# Patient Record
Sex: Male | Born: 1995 | Race: White | Hispanic: No | Marital: Single | Smoking: Never smoker
Health system: Southern US, Community
[De-identification: ages and names within clinical notes are randomized; demographics above are authoritative.]

## PROBLEM LIST (undated history)

## (undated) ENCOUNTER — Ambulatory Visit (HOSPITAL_COMMUNITY): Admission: EM | Payer: 59

## (undated) DIAGNOSIS — N200 Calculus of kidney: Secondary | ICD-10-CM

## (undated) HISTORY — DX: Calculus of kidney: N20.0

## (undated) HISTORY — PX: TYMPANOSTOMY TUBE PLACEMENT: SHX32

## (undated) HISTORY — PX: TONSILLECTOMY: SUR1361

---
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
## 2015-05-07 DIAGNOSIS — S61219D Laceration without foreign body of unspecified finger without damage to nail, subsequent encounter: Secondary | ICD-10-CM | POA: Insufficient documentation

## 2017-05-09 ENCOUNTER — Ambulatory Visit
Admission: EM | Admit: 2017-05-09 | Discharge: 2017-05-09 | Disposition: A | Discharge status: Home / Self Care | Payer: 59 | Attending: Family Medicine | Admitting: Family Medicine

## 2017-05-09 ENCOUNTER — Encounter: Payer: Self-pay | Admitting: Emergency Medicine

## 2017-05-09 ENCOUNTER — Other Ambulatory Visit: Payer: Self-pay

## 2017-05-09 DIAGNOSIS — R1011 Right upper quadrant pain: Secondary | ICD-10-CM

## 2017-05-09 LAB — COMPREHENSIVE METABOLIC PANEL
ALT: 14 U/L — ABNORMAL LOW (ref 17–63)
AST: 22 U/L (ref 15–41)
Albumin: 5 g/dL (ref 3.5–5.0)
Alkaline Phosphatase: 57 U/L (ref 38–126)
Anion gap: 7 (ref 5–15)
BUN: 16 mg/dL (ref 6–20)
CO2: 28 mmol/L (ref 22–32)
Calcium: 9.4 mg/dL (ref 8.9–10.3)
Chloride: 103 mmol/L (ref 101–111)
Creatinine, Ser: 1.27 mg/dL — ABNORMAL HIGH (ref 0.61–1.24)
GFR calc Af Amer: >60 mL/min (ref >60)
GFR calc non Af Amer: >60 mL/min (ref >60)
Glucose, Bld: 87 mg/dL (ref 65–99)
Potassium: 4.4 mmol/L (ref 3.5–5.1)
Sodium: 138 mmol/L (ref 135–145)
Total Bilirubin: 0.5 mg/dL (ref 0.3–1.2)
Total Protein: 7.9 g/dL (ref 6.5–8.1)

## 2017-05-09 LAB — CBC WITH DIFFERENTIAL/PLATELET
Basophils Absolute: 0 10*3/uL (ref 0–0.1)
Basophils Relative: 1 %
Eosinophils Absolute: 0.2 10*3/uL (ref 0–0.7)
Eosinophils Relative: 2 %
HCT: 46.6 % (ref 40.0–52.0)
Hemoglobin: 16.2 g/dL (ref 13.0–18.0)
Lymphocytes Relative: 37 %
Lymphs Abs: 2.8 10*3/uL (ref 1.0–3.6)
MCH: 30.5 pg (ref 26.0–34.0)
MCHC: 34.7 g/dL (ref 32.0–36.0)
MCV: 87.9 fL (ref 80.0–100.0)
Monocytes Absolute: 0.5 10*3/uL (ref 0.2–1.0)
Monocytes Relative: 7 %
Neutro Abs: 4.1 10*3/uL (ref 1.4–6.5)
Neutrophils Relative %: 53 %
Platelets: 271 10*3/uL (ref 150–440)
RBC: 5.31 MIL/uL (ref 4.40–5.90)
RDW: 12.7 % (ref 11.5–14.5)
WBC: 7.7 10*3/uL (ref 3.8–10.6)

## 2017-05-09 LAB — LIPASE, BLOOD: Lipase: 33 U/L (ref 11–51)

## 2017-05-09 MED ORDER — ONDANSETRON 8 MG PO TBDP: 8.0000 mg | ORAL_TABLET | Freq: Two times a day (BID) | ORAL | 0 refills | Status: DC

## 2017-05-09 MED ORDER — PANTOPRAZOLE SODIUM 20 MG PO TBEC: 20.0000 mg | DELAYED_RELEASE_TABLET | Freq: Every day | ORAL | 0 refills | Status: DC

## 2017-05-09 NOTE — ED Triage Notes (Signed)
Patient c/o abdominal pain off and on for years.  Patient states that his pain gets worse after he eats.

## 2017-05-09 NOTE — Progress Notes (Signed)
Gastroenterology Consultation  Referring Provider:     Hilario Quarry* Primary Care Physician:  [facility] Primary Gastroenterologist:  Dr. Servando Snare     Reason for Consultation:     Abdominal pain        HPI:   Aiden Helzer is a 22 y.o. y/o male referred for consultation & management of Abdominal pain by Dr. Terrence Dupont, [facility].  This patient comes today after reporting many years of abdominal pain with nausea.  The patient reports that the abdominal pain usually happens approximately 30 minutes after he eats.  The patient has never been seen in the past for this.  He had gone to urgent care yesterday for the same symptoms.  While there the patient was referred to see me and is now here today. The patient also states that he has been infected with to tick borne disease in the past.  When seen at the urgent care yesterday by the time he had seen a physician the urgent care doctor stated the patient was not having any abdominal pain or nausea. The patient was seen in urgent care in February for vomiting.  The patient states that his symptoms are much more prevalent in the summer and rarely happen any other time.  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  He also reports that he has had frequent bouts of nausea with dry heaving in the shower.  There was one instance that he states happened after eating a large [place] meal.  There is no report of any unexplained weight loss fevers chills black stools or bloody stools.  He also states that this is been going on for years.  No past medical history on file.  Past Surgical History:  Procedure Laterality Date  . TONSILLECTOMY    . TYMPANOSTOMY TUBE PLACEMENT      Prior to Admission medications   Medication Sig Start Date End Date Taking? Authorizing Provider  ondansetron (ZOFRAN ODT) 8 MG disintegrating tablet Take 1 tablet (8 mg total) by mouth 2 (two) times  daily. 05/09/17   Lutricia Feil, PA-C  pantoprazole (PROTONIX) 20 MG tablet Take 1 tablet (20 mg total) by mouth daily. 05/09/17   Lutricia Feil, PA-C    No family history on file.   Social History   Tobacco Use  . Smoking status: Never Smoker  . Smokeless tobacco: Current User    Types: Chew  Substance Use Topics  . Alcohol use: Yes  . Drug use: Never    Allergies as of 05/10/2017  . (No Known Allergies)    Review of Systems:    All systems reviewed and negative except where noted in HPI.   Physical Exam:  There were no vitals taken for this visit. No LMP for male patient. Psych:  Alert and cooperative. Normal mood and affect. General:   Alert,  Well-developed, well-nourished, pleasant and cooperative in NAD Head:  Normocephalic and atraumatic. Eyes:  Sclera clear, no icterus.   Conjunctiva pink. Ears:  Normal auditory acuity. Nose:  No deformity, discharge, or lesions. Mouth:  No deformity or lesions,oropharynx pink & moist. Neck:  Supple; no masses or thyromegaly. Lungs:  Respirations even and unlabored.  Clear throughout to auscultation.   No wheezes, crackles, or rhonchi. No acute distress. Heart:  Regular rate and rhythm; no murmurs, clicks, rubs, or gallops. Abdomen:  Normal bowel sounds.  No bruits.  Soft, non-tender and  non-distended without masses, hepatosplenomegaly or hernias noted.  No guarding or rebound tenderness.  Negative Carnett sign.   Rectal:  Deferred.  Msk:  Symmetrical without gross deformities.  Good, equal movement & strength bilaterally. Pulses:  Normal pulses noted. Extremities:  No clubbing or edema.  No cyanosis. Neurologic:  Alert and oriented x3;  grossly normal neurologically. Skin:  Intact without significant lesions or rashes.  No jaundice. Lymph Nodes:  No significant cervical adenopathy. Psych:  Alert and cooperative. Normal mood and affect.  Imaging Studies: No results found.  Assessment and Plan:   Andrews Tener is a 22  y.o. y/o male who comes in with a report of nausea and dry heaving mostly in the summer and usually happens when he is overheated from working or when he takes a shower in the morning.  It is not usually associated with food except for one instance when he ate a large amount of [place] food.  The patient has been told that this may be a vasovagal episode due to him being overheated and he has been told to decrease the temperature of his shower and to stay hydrated to see if his symptoms improve.  The patient has also been told that he should take the Protonix he was given just in case there is any acid causality for his nausea and vomiting.  I am suspect whether this is GI related since it is not associated with eating or drinking except for one episode but has been going on with him taking showers and being overheated at work for many years.  The patient has been explained the plan and agrees with it.  Midge Minium, MD. Clementeen Graham   Note: This dictation was prepared with Dragon dictation along with smaller phrase technology. Any transcriptional errors that result from this process are unintentional.

## 2017-05-09 NOTE — ED Provider Notes (Signed)
[facility]    CSN: [id number] Arrival date & time: 05/09/17  1441     History   Chief Complaint Chief Complaint  Patient presents with  . Abdominal Pain    HPI Todd Moon is a 22 y.o. male.   HPI  22 year old male presents with abdominal pain and nausea that he has had for years.  He states that the pain seems to be worse after he eats typically 30 minutes, last for an hour or 2 and then abates.  He will be nauseated but does not vomit.  He denies any diarrhea.  His pain apparently is mostly in the right upper quadrant.Does  Not take any medications.  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 He notices that the symptoms are always worse when it is hot outside or when the patient becomes overheated.  States that he has had this worked-up in the past but nobody has been able to tell him why he may have these symptoms.  Never seen a gastroenterologist. Present time today he denies any abdominal pain or nausea          History reviewed. No pertinent past medical history.  There are no active problems to display for this patient.   Past Surgical History:  Procedure Laterality Date  . TONSILLECTOMY    . TYMPANOSTOMY TUBE PLACEMENT         Home Medications    Prior to Admission medications   Medication Sig Start Date End Date Taking? Authorizing Provider  ondansetron (ZOFRAN ODT) 8 MG disintegrating tablet Take 1 tablet (8 mg total) by mouth 2 (two) times daily. 05/09/17   Lutricia Feil, PA-C  pantoprazole (PROTONIX) 20 MG tablet Take 1 tablet (20 mg total) by mouth daily. 05/09/17   Lutricia Feil, PA-C    Family History History reviewed. No pertinent family history.  Social History Social History   Tobacco Use  . Smoking status: Never Smoker  . Smokeless tobacco: Current User    Types: Chew  Substance Use Topics  . Alcohol use: Yes  . Drug use: Never     Allergies   Patient has  no known allergies.   Review of Systems Review of Systems  Constitutional: Positive for activity change and appetite change. Negative for chills, fatigue and fever.  Gastrointestinal: Positive for abdominal pain and nausea. Negative for blood in stool, constipation, diarrhea, rectal pain and vomiting.  All other systems reviewed and are negative.    Physical Exam Triage Vital Signs ED Triage Vitals  Enc Vitals Group     BP 05/09/17 1456 126/66     Pulse Rate 05/09/17 1456 82     Resp 05/09/17 1456 14     Temp 05/09/17 1456 98.6 F (37 C)     Temp Source 05/09/17 1456 Oral     SpO2 05/09/17 1456 98 %     Weight 05/09/17 1453 150 lb (68 kg)     Height 05/09/17 1453  (1.803 m)     Head Circumference --      Peak Flow --      Pain Score 05/09/17 1453 3     Pain Loc --      Pain Edu? --      Excl. in [place]? --    No data found.  Updated Vital Signs BP 126/66 (BP Location: Left Arm)   Pulse 82   Temp 98.6 F (  37 C) (Oral)   Resp 14   Ht  (1.803 m)   Wt 150 lb (68 kg)   SpO2 98%   BMI 20.92 kg/m   Visual Acuity Right Eye Distance:   Left Eye Distance:   Bilateral Distance:    Right Eye Near:   Left Eye Near:    Bilateral Near:     Physical Exam  Constitutional: He is oriented to person, place, and time. He appears well-developed and well-nourished.  Non-toxic appearance. He does not appear ill. No distress.  HENT:  Mouth/Throat: Oropharynx is clear and moist.  Eyes: Pupils are equal, round, and reactive to light.  Abdominal: Soft. Normal appearance and bowel sounds are normal. There is tenderness in the right upper quadrant. There is positive Murphy's sign.  Neurological: He is alert and oriented to person, place, and time.  Skin: Skin is warm and dry.  Psychiatric: He has a normal mood and affect. His behavior is normal.  Nursing note and vitals reviewed.    UC Treatments / Results  Labs (all labs ordered are listed, but only abnormal results  are displayed) Labs Reviewed  COMPREHENSIVE METABOLIC PANEL - Abnormal; Notable for the following components:      Result Value   Creatinine, Ser 1.27 (*)    ALT 14 (*)    All other components within normal limits  CBC WITH DIFFERENTIAL/PLATELET  LIPASE, BLOOD    EKG None  Radiology No results found.  Procedures Procedures (including critical care time)  Medications Ordered in UC Medications - No data to display  Initial Impression / Assessment and Plan / UC Course  I have reviewed the triage vital signs and the nursing notes.  Pertinent labs & imaging results that were available during my care of the patient were reviewed by me and considered in my medical decision making (see chart for details).     Plan: 1. Test/x-ray results and diagnosis reviewed with patient 2. rx as per orders; risks, benefits, potential side effects reviewed with patient 3. Recommend supportive treatment with Protonix and Zofran as necessary.  Commended that he follow-up with a gastroenterologist for further evaluation and studies as deemed necessary.  A name was provided to him.  His examination today was reassuring but I am concerned that this may represent a gallbladder issue. 4. F/u prn if symptoms worsen or don't improve  Final Clinical Impressions(s) / UC Diagnoses   Final diagnoses:  Right upper quadrant abdominal pain   Discharge Instructions   None    ED Prescriptions    Medication Sig Dispense Auth. Provider   ondansetron (ZOFRAN ODT) 8 MG disintegrating tablet Take 1 tablet (8 mg total) by mouth 2 (two) times daily. 6 tablet Ovid Curd P, PA-C   pantoprazole (PROTONIX) 20 MG tablet Take 1 tablet (20 mg total) by mouth daily. 30 tablet Lutricia Feil, PA-C     Controlled Substance Prescriptions [place] Controlled Substance Registry consulted? Not Applicable   Lutricia Feil, PA-C 05/09/17 8469

## 2017-05-10 ENCOUNTER — Encounter: Payer: Self-pay | Admitting: Gastroenterology

## 2017-05-10 ENCOUNTER — Encounter: Payer: Self-pay | Admitting: *Deleted

## 2017-05-10 ENCOUNTER — Ambulatory Visit: Payer: 59 | Admitting: Gastroenterology

## 2017-05-10 VITALS — BP 121/66 | HR 70 | Ht 71.0 in | Wt 144.0 lb

## 2017-05-10 DIAGNOSIS — R1115 Cyclical vomiting syndrome unrelated to migraine: Secondary | ICD-10-CM

## 2017-05-10 DIAGNOSIS — G43A1 Cyclical vomiting, intractable: Secondary | ICD-10-CM | POA: Diagnosis not present

## 2019-02-25 ENCOUNTER — Ambulatory Visit
Admission: EM | Admit: 2019-02-25 | Discharge: 2019-02-25 | Disposition: A | Discharge status: Home / Self Care | Payer: Self-pay | Attending: Family Medicine | Admitting: Family Medicine

## 2019-02-25 ENCOUNTER — Other Ambulatory Visit: Payer: Self-pay

## 2019-02-25 ENCOUNTER — Ambulatory Visit (INDEPENDENT_AMBULATORY_CARE_PROVIDER_SITE_OTHER): Payer: Self-pay

## 2019-02-25 DIAGNOSIS — N200 Calculus of kidney: Secondary | ICD-10-CM | POA: Insufficient documentation

## 2019-02-25 DIAGNOSIS — R1031 Right lower quadrant pain: Secondary | ICD-10-CM

## 2019-02-25 LAB — URINALYSIS, COMPLETE (UACMP) WITH MICROSCOPIC
Bacteria, UA: NONE SEEN
Glucose, UA: NEGATIVE mg/dL
Leukocytes,Ua: NEGATIVE
Nitrite: NEGATIVE
Protein, ur: 100 mg/dL — AB
RBC / HPF: >50 RBC/hpf (ref 0–5)
Specific Gravity, Urine: >1.03 — ABNORMAL HIGH (ref 1.005–1.030)
Squamous Epithelial / LPF: NONE SEEN (ref 0–5)
pH: 5.5 (ref 5.0–8.0)

## 2019-02-25 LAB — COMPREHENSIVE METABOLIC PANEL
ALT: 28 U/L (ref 0–44)
AST: 25 U/L (ref 15–41)
Albumin: 5.1 g/dL — ABNORMAL HIGH (ref 3.5–5.0)
Alkaline Phosphatase: 62 U/L (ref 38–126)
Anion gap: 9 (ref 5–15)
BUN: 17 mg/dL (ref 6–20)
CO2: 24 mmol/L (ref 22–32)
Calcium: 9.3 mg/dL (ref 8.9–10.3)
Chloride: 102 mmol/L (ref 98–111)
Creatinine, Ser: 1.21 mg/dL (ref 0.61–1.24)
GFR calc Af Amer: >60 mL/min (ref >60)
GFR calc non Af Amer: >60 mL/min (ref >60)
Glucose, Bld: 114 mg/dL — ABNORMAL HIGH (ref 70–99)
Potassium: 4.2 mmol/L (ref 3.5–5.1)
Sodium: 135 mmol/L (ref 135–145)
Total Bilirubin: 1.2 mg/dL (ref 0.3–1.2)
Total Protein: 8 g/dL (ref 6.5–8.1)

## 2019-02-25 LAB — CBC WITH DIFFERENTIAL/PLATELET
Abs Immature Granulocytes: 0.03 10*3/uL (ref 0.00–0.07)
Basophils Absolute: 0.1 10*3/uL (ref 0.0–0.1)
Basophils Relative: 1 %
Eosinophils Absolute: 0.1 10*3/uL (ref 0.0–0.5)
Eosinophils Relative: 1 %
HCT: 48.1 % (ref 39.0–52.0)
Hemoglobin: 16.8 g/dL (ref 13.0–17.0)
Immature Granulocytes: 0 %
Lymphocytes Relative: 31 %
Lymphs Abs: 2.3 10*3/uL (ref 0.7–4.0)
MCH: 30.1 pg (ref 26.0–34.0)
MCHC: 34.9 g/dL (ref 30.0–36.0)
MCV: 86.2 fL (ref 80.0–100.0)
Monocytes Absolute: 0.5 10*3/uL (ref 0.1–1.0)
Monocytes Relative: 7 %
Neutro Abs: 4.3 10*3/uL (ref 1.7–7.7)
Neutrophils Relative %: 60 %
Platelets: 306 10*3/uL (ref 150–400)
RBC: 5.58 MIL/uL (ref 4.22–5.81)
RDW: 11.6 % (ref 11.5–15.5)
WBC: 7.3 10*3/uL (ref 4.0–10.5)
nRBC: 0 % (ref 0.0–0.2)

## 2019-02-25 MED ORDER — ONDANSETRON 4 MG PO TBDP: 4.0000 mg | ORAL_TABLET | Freq: Three times a day (TID) | ORAL | 0 refills | Status: DC | PRN

## 2019-02-25 MED ORDER — ONDANSETRON HCL 4 MG/2ML IJ SOLN: 8.0000 mg | Freq: Once | INTRAMUSCULAR | Status: DC

## 2019-02-25 MED ORDER — IBUPROFEN 800 MG PO TABS: 800.0000 mg | ORAL_TABLET | Freq: Three times a day (TID) | ORAL | 0 refills | Status: DC

## 2019-02-25 MED ORDER — TRAMADOL HCL 50 MG PO TABS: 50.0000 mg | ORAL_TABLET | Freq: Three times a day (TID) | ORAL | 0 refills | Status: DC | PRN

## 2019-02-25 MED ORDER — ONDANSETRON 8 MG PO TBDP
8.0000 mg | ORAL_TABLET | Freq: Once | ORAL | Status: AC
  Administered 2019-02-25: 8 mg via ORAL

## 2019-02-25 MED ORDER — KETOROLAC TROMETHAMINE 60 MG/2ML IM SOLN
60.0000 mg | Freq: Once | INTRAMUSCULAR | Status: AC
  Administered 2019-02-25: 60 mg via INTRAMUSCULAR

## 2019-02-25 NOTE — ED Triage Notes (Signed)
Pt states sx started a month ago and was "peeing blood". Started again last night. 10/10 flank pain and difficulty urinating.

## 2019-02-25 NOTE — ED Provider Notes (Signed)
[facility]    CSN: [id number] Arrival date & time: 02/25/19  9147      History   Chief Complaint Chief Complaint  Patient presents with  . Flank Pain  . Nausea    HPI Todd Moon is a 24 y.o. male.   Todd Moon presents with complaints of right flank pain and urinary symptoms. States approximately 3-4 weeks ago he woke with right flank pain and had blood to his urine for at least a day or so. The flank pain then did go away but then he weeks of urgency to urinate and colicky discomfort, "swelling and numb" to right groin, ever since. This morning woke with 10/10 worse right flank pain as well as orange colored urine. Vomiting due to pain. Denies any previous similar. Denies concern for STD, denies penile discharge. No fevers. No personal history of kidney stones, family history, however.    ROS per HPI, negative if not otherwise mentioned.      History reviewed. No pertinent past medical history.  Patient Active Problem List   Diagnosis Date Noted  . Laceration of finger of left hand, subsequent encounter 05/07/2015  . Tremor 07/26/2012    Past Surgical History:  Procedure Laterality Date  . TONSILLECTOMY    . TYMPANOSTOMY TUBE PLACEMENT         Home Medications    Prior to Admission medications   Medication Sig Start Date End Date Taking? Authorizing Provider  ibuprofen (ADVIL) 800 MG tablet Take 1 tablet (800 mg total) by mouth 3 (three) times daily. 02/25/19   Zigmund Gottron, NP  ondansetron (ZOFRAN-ODT) 4 MG disintegrating tablet Take 1 tablet (4 mg total) by mouth every 8 (eight) hours as needed for nausea or vomiting. 02/25/19   Augusto Gamble B, NP  pantoprazole (PROTONIX) 20 MG tablet Take 1 tablet (20 mg total) by mouth daily. 05/09/17   Lorin Picket, PA-C  traMADol (ULTRAM) 50 MG tablet Take 1 tablet (50 mg total) by mouth every 8 (eight) hours as needed. 02/25/19   Zigmund Gottron, NP    Family History History reviewed. No  pertinent family history.  Social History Social History   Tobacco Use  . Smoking status: Never Smoker  . Smokeless tobacco: Current User    Types: Chew  Substance Use Topics  . Alcohol use: Yes    Comment: social  . Drug use: Never     Allergies   Patient has no known allergies.   Review of Systems Review of Systems   Physical Exam Triage Vital Signs ED Triage Vitals  Enc Vitals Group     BP 02/25/19 0931 139/88     Pulse Rate 02/25/19 0931 83     Resp 02/25/19 0931 18     Temp 02/25/19 0931 98.2 F (36.8 C)     Temp Source 02/25/19 0931 Oral     SpO2 02/25/19 0931 98 %     Weight 02/25/19 0928 160 lb (72.6 kg)     Height 02/25/19 0928 5\' 10"  (1.778 m)     Head Circumference --      Peak Flow --      Pain Score 02/25/19 0928 10     Pain Loc --      Pain Edu? --      Excl. in [place]? --    No data found.  Updated Vital Signs BP 139/88 (BP Location: Left Arm)   Pulse 83   Temp 98.2 F (36.8 C) (Oral)  Resp 18   Ht 5\' 10"  (1.778 m)   Wt 160 lb (72.6 kg)   SpO2 98%   BMI 22.96 kg/m    Physical Exam Constitutional:      Appearance: He is well-developed.  Cardiovascular:     Rate and Rhythm: Normal rate.  Pulmonary:     Effort: Pulmonary effort is normal.  Abdominal:     Palpations: Abdomen is soft.     Tenderness: There is right CVA tenderness.     Comments: Patient initially unable to even sit down due to pain, vomiting due to pain. Pain much improved and resting comfortably s/p zofran and toradol with mild right CVA tenderness and mild suprapubic pressure   Skin:    General: Skin is warm and dry.  Neurological:     Mental Status: He is alert and oriented to person, place, and time.      UC Treatments / Results  Labs (all labs ordered are listed, but only abnormal results are displayed) Labs Reviewed  URINALYSIS, COMPLETE (UACMP) WITH MICROSCOPIC - Abnormal; Notable for the following components:      Result Value   APPearance CLOUDY (*)      Specific Gravity, Urine >1.030 (*)    Hgb urine dipstick LARGE (*)    Bilirubin Urine SMALL (*)    Ketones, ur TRACE (*)    Protein, ur 100 (*)    All other components within normal limits  COMPREHENSIVE METABOLIC PANEL - Abnormal; Notable for the following components:   Glucose, Bld 114 (*)    Albumin 5.1 (*)    All other components within normal limits  CBC WITH DIFFERENTIAL/PLATELET    EKG   Radiology CT Renal Stone Study  Result Date: 02/25/2019 CLINICAL DATA:  Right flank pain intermittent dysuria and hematuria EXAM: CT ABDOMEN AND PELVIS WITHOUT CONTRAST TECHNIQUE: Multidetector CT imaging of the abdomen and pelvis was performed following the standard protocol without oral or IV contrast. COMPARISON:  None. FINDINGS: Lower chest: Lung bases are clear. Hepatobiliary: No focal liver lesions are appreciable on this noncontrast enhanced study. Gallbladder wall thickness is normal. There is no evident biliary duct dilatation. Pancreas: There is no pancreatic mass or inflammatory focus. Spleen: No splenic lesions are evident. Adrenals/Urinary Tract: Adrenals bilaterally appear normal. There is mild hydronephrosis on the right. No hydronephrosis evident on the left. No evident renal mass on either side. 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 There are scattered 1 mm calculi throughout each kidney. There is a 3 x 2 mm calculus in the distal right ureter near the ureterovesical junction. No other ureteral calculi are evident. Urinary bladder wall thickness is within normal limits for nearly empty state. Stomach/Bowel: There is no appreciable bowel wall or mesenteric thickening. Terminal ileum appears normal. No evident bowel obstruction. No free air or portal venous air. Vascular/Lymphatic: There is no abdominal aortic aneurysm. No vascular lesions are evident on this noncontrast enhanced study. There is no evident adenopathy in the abdomen or pelvis. Reproductive: Prostate and seminal  vesicles are normal in size and contour. No evident pelvic mass. Other: Appendix appears unremarkable. No evident abscess or ascites in the abdomen or pelvis. Musculoskeletal: No blastic or lytic bone lesions. No intramuscular or abdominal wall lesions are evident. IMPRESSION: 1. Mild hydronephrosis on the right with a 3 x 2 mm calculus in the distal right ureter near the ureterovesical junction. 2. 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 3. No bowel obstruction. No abscess in the abdomen or  pelvis. Appendix appears normal. Electronically Signed   By: Bretta Bang III M.D.   On: 02/25/2019 10:38    Procedures Procedures (including critical care time)  Medications Ordered in UC Medications  ketorolac (TORADOL) injection 60 mg (60 mg Intramuscular Given 02/25/19 0938)  ondansetron (ZOFRAN-ODT) disintegrating tablet 8 mg (8 mg Oral Given 02/25/19 7322)    Initial Impression / Assessment and Plan / UC Course  I have reviewed the triage vital signs and the nursing notes.  Pertinent labs & imaging results that were available during my care of the patient were reviewed by me and considered in my medical decision making (see chart for details).     10:25: Initial presentation and labs consistent with kidney stone, pain improved with toradol. Patient agreeable to CT as symptoms initially started approximately 3-4 weeks ago, has had colicky discomfort as well as urgency for weeks following initial onset of pain.    10:55: CT with mild hydronephrosis on the right with 3x2 mm stone at the ureterovesical junction. Small scattered 72mm calculi throughout both kidnies also noted by radiologist.   Patient without pain still s/p toradol. Stone is fairly small and has progressed, suspect that this may be a second stone rather than one which he has been symptomatic from for the past month. Pain management, fluids recommended.  Follow up with urology recommended if no  passage in the next few days. Return precautions provided. Patient verbalized understanding and agreeable to plan.  Ambulatory out of clinic without difficulty.    Final Clinical Impressions(s) / UC Diagnoses   Final diagnoses:  Kidney stone     Discharge Instructions     Your CT scan does demonstrate a kidney stone. It is not obviously obstructing, which is good. Appears likely that you could pass this. You do have other smaller stones visualized in both kidneys, also.  I question if you had another stone a few weeks ago, rather than this being the same one.  Ibuprofen three times a day, don't take for another 8 hours, tramadol for breakthrough pain. Zofran every 8 hours as needed for nausea or vomiting.   Drink water frequently and regularly to empty your bladder regularly.  I would recommend making a follow up appointment with urology for the end of the week so if haven't passed this or still uncomfortable you have follow up.  If significantly worsening or unable to urinate, fevers, or otherwise worsening please go to the ER.     ED Prescriptions    Medication Sig Dispense Auth. Provider   ibuprofen (ADVIL) 800 MG tablet Take 1 tablet (800 mg total) by mouth 3 (three) times daily. 30 tablet Makara Lanzo, Dorene Grebe B, NP   ondansetron (ZOFRAN-ODT) 4 MG disintegrating tablet Take 1 tablet (4 mg total) by mouth every 8 (eight) hours as needed for nausea or vomiting. 12 tablet Linus Mako B, NP   traMADol (ULTRAM) 50 MG tablet Take 1 tablet (50 mg total) by mouth every 8 (eight) hours as needed. 10 tablet Georgetta Haber, NP     I have reviewed the PDMP during this encounter.   Georgetta Haber, NP 02/25/19 1059

## 2019-02-25 NOTE — Discharge Instructions (Signed)
Your CT scan does demonstrate a kidney stone. It is not obviously obstructing, which is good. Appears likely that you could pass this. You do have other smaller stones visualized in both kidneys, also.  I question if you had another stone a few weeks ago, rather than this being the same one.  Ibuprofen three times a day, don't take for another 8 hours, tramadol for breakthrough pain. Zofran every 8 hours as needed for nausea or vomiting.   Drink water frequently and regularly to empty your bladder regularly.  I would recommend making a follow up appointment with urology for the end of the week so if haven't passed this or still uncomfortable you have follow up.  If significantly worsening or unable to urinate, fevers, or otherwise worsening please go to the ER.

## 2019-09-01 ENCOUNTER — Ambulatory Visit
Admission: EM | Admit: 2019-09-01 | Discharge: 2019-09-01 | Disposition: A | Discharge status: Home / Self Care | Payer: Self-pay | Attending: Family Medicine | Admitting: Family Medicine

## 2019-09-01 ENCOUNTER — Encounter: Payer: Self-pay | Admitting: Emergency Medicine

## 2019-09-01 ENCOUNTER — Other Ambulatory Visit: Payer: Self-pay

## 2019-09-01 DIAGNOSIS — Z20822 Contact with and (suspected) exposure to covid-19: Secondary | ICD-10-CM | POA: Insufficient documentation

## 2019-09-01 NOTE — Discharge Instructions (Signed)
Results available tomorrow.  Take care  Dr. Adriana Simas

## 2019-09-01 NOTE — ED Provider Notes (Signed)
[facility]    CSN: [id number] Arrival date & time: 09/01/19  1035      History   Chief Complaint Chief Complaint  Patient presents with   Covid Exposure   raspy voice   dry throat   HPI   24 year old male presents with the above complaints.  Patient's mother has just tested positive for COVID-19.  He reports a raspy voice and a dry throat in the past 2 to 3 days.  No other symptoms.  No fever.  He desires Covid testing today.  No other reported symptoms.  No other complaints.  Past Medical History:  Diagnosis Date   Kidney stones     Patient Active Problem List   Diagnosis Date Noted   Laceration of finger of left hand, subsequent encounter 05/07/2015   Tremor 07/26/2012    Past Surgical History:  Procedure Laterality Date   TONSILLECTOMY     TYMPANOSTOMY TUBE PLACEMENT         Home Medications    Prior to Admission medications   Medication Sig Start Date End Date Taking? Authorizing Provider  pantoprazole (PROTONIX) 20 MG tablet Take 1 tablet (20 mg total) by mouth daily. 05/09/17 09/01/19  Lutricia Feil, PA-C    Family History Family History  Problem Relation Age of Onset   Healthy Mother    Healthy Father     Social History Social History   Tobacco Use   Smoking status: Never Smoker   Smokeless tobacco: Former Neurosurgeon    Types: Associate Professor Use: Never used  Substance Use Topics   Alcohol use: Yes    Comment: social   Drug use: Never     Allergies   Patient has no known allergies.   Review of Systems Review of Systems  Constitutional: Negative.   HENT: Positive for sore throat and voice change.    Physical Exam Triage Vital Signs ED Triage Vitals  Enc Vitals Group     BP 09/01/19 1120 119/89     Pulse Rate 09/01/19 1120 90     Resp 09/01/19 1120 18     Temp 09/01/19 1120 98.1 F (36.7 C)     Temp Source 09/01/19 1120 Oral     SpO2 09/01/19 1120 99 %     Weight 09/01/19 1121 160 lb  (72.6 kg)     Height 09/01/19 1121 5\' 10"  (1.778 m)     Head Circumference --      Peak Flow --      Pain Score 09/01/19 1120 0     Pain Loc --      Pain Edu? --      Excl. in GC? --    Updated Vital Signs BP 119/89 (BP Location: Left Arm)    Pulse 90    Temp 98.1 F (36.7 C) (Oral)    Resp 18    Ht 5\' 10"  (1.778 m)    Wt 72.6 kg    SpO2 99%    BMI 22.96 kg/m   Visual Acuity Right Eye Distance:   Left Eye Distance:   Bilateral Distance:    Right Eye Near:   Left Eye Near:    Bilateral Near:     Physical Exam Constitutional:      General: He is not in acute distress.    Appearance: Normal appearance. He is not ill-appearing.  HENT:     Head: Normocephalic and atraumatic.     Mouth/Throat:  Pharynx: Oropharynx is clear. No oropharyngeal exudate or posterior oropharyngeal erythema.  Eyes:     General:        Right eye: No discharge.        Left eye: No discharge.     Conjunctiva/sclera: Conjunctivae normal.  Cardiovascular:     Rate and Rhythm: Normal rate and regular rhythm.  Pulmonary:     Effort: Pulmonary effort is normal.     Breath sounds: Normal breath sounds. No wheezing, rhonchi or rales.  Neurological:     Mental Status: He is alert.  Psychiatric:        Mood and Affect: Mood normal.        Behavior: Behavior normal.    UC Treatments / Results  Labs (all labs ordered are listed, but only abnormal results are displayed) Labs Reviewed  SARS CORONAVIRUS 2 (TAT 6-24 HRS)    EKG   Radiology No results found.  Procedures Procedures (including critical care time)  Medications Ordered in UC Medications - No data to display  Initial Impression / Assessment and Plan / UC Course  I have reviewed the triage vital signs and the nursing notes.  Pertinent labs & imaging results that were available during my care of the patient were reviewed by me and considered in my medical decision making (see chart for details).    24 year old male presents for  Covid testing.  Patient states that he has had a raspy voice and a dry throat for the past few days.  He has had a recent Covid exposure.  Desires testing today.  He is feeling well.  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  Awaiting test results.  Final Clinical Impressions(s) / UC Diagnoses   Final diagnoses:  Exposure to COVID-19 virus     Discharge Instructions     Results available tomorrow.  Take care  Dr. Adriana Simas    ED Prescriptions    None     PDMP not reviewed this encounter.   Tommie Sams, [place] 09/01/19 1254

## 2019-09-01 NOTE — ED Triage Notes (Signed)
Patient in today c/o +covid exposure, raspy voice and dry throat x 2-3 days. Patient's mother tested positive for covid yesterday. Patient has taken OTC Mucinex for his symptoms.

## 2019-09-02 LAB — SARS CORONAVIRUS 2 (TAT 6-24 HRS): SARS Coronavirus 2: POSITIVE — AB

## 2020-08-08 ENCOUNTER — Ambulatory Visit (HOSPITAL_COMMUNITY)
Admission: EM | Admit: 2020-08-08 | Discharge: 2020-08-08 | Disposition: A | Discharge status: Home / Self Care | Payer: 59 | Attending: Family | Admitting: Family

## 2020-08-08 ENCOUNTER — Other Ambulatory Visit: Payer: Self-pay

## 2020-08-08 DIAGNOSIS — F411 Generalized anxiety disorder: Secondary | ICD-10-CM | POA: Diagnosis not present

## 2020-08-08 MED ORDER — HYDROXYZINE HCL 25 MG PO TABS: 25.0000 mg | ORAL_TABLET | Freq: Three times a day (TID) | ORAL | 0 refills | Status: DC | PRN

## 2020-08-08 MED ORDER — HYDROXYZINE HCL 25 MG PO TABS: 25.0000 mg | ORAL_TABLET | Freq: Three times a day (TID) | ORAL | 0 refills | Status: AC | PRN

## 2020-08-08 NOTE — ED Provider Notes (Addendum)
Behavioral Health Urgent Care Medical Screening Exam  Patient Name: Todd Moon MRN: [id number] Date of Evaluation: 08/08/20 Chief Complaint:   Diagnosis:  Final diagnoses:  GAD (generalized anxiety disorder)    History of Present illness: Todd Moon is a 25 y.o. male.  Presents as a walk-in to [facility] urgent care accompanied by his wife.  He is reporting reoccurring thoughts of impending doom.  Reports his symptoms started a few days ago. "  It scared me."  He denies suicidal or homicidal ideations.  Denies auditory or visual hallucinations.  Did not previous inpatient admissions.  Denied history with mental health illness.  Does report using marijuana.  Reports paranoid ideations mainly after smoking marijuana.  NP spoke to wife and patient regarding any safety concerns. Does report having hunting guns in the home however states that weapons are secured.  And has no thoughts to harm himself.  Patient to initiate hydroxyzine for anxiety attacks.  Patient to consider starting antidepressants when he come's to partial hospitalization programming.  Patient was receptive to plan. Support, encouragement and reassurances was provided.   Psychiatric Specialty Exam  Presentation  General Appearance:Appropriate for Environment  Eye Contact:Fair  Speech:Clear and Coherent  Speech Volume:Normal  Handedness:Right   Mood and Affect  Mood:Anxious; Depressed  Affect:Congruent   Thought Process  Thought Processes:Coherent  Descriptions of Associations:Intact  Orientation:Full (Time, Place and Person)  Thought Content:Logical    Hallucinations:None  Ideas of Reference:None  Suicidal Thoughts:No  Homicidal Thoughts:No   Sensorium  Memory:Immediate Good; Recent Good; Remote Good  Judgment:Fair  Insight:Fair   Executive Functions  Concentration:Fair  Attention Span:Fair  Recall:Good  Fund of Knowledge:Good  Language:Good   Psychomotor Activity   Psychomotor Activity: No data recorded  Assets  Assets:Desire for Improvement; Intimacy; Social Support   Sleep  Sleep:Fair  Number of hours:  No data recorded  Nutritional Assessment (For OBS and FBC admissions only) Has the patient had a weight loss or gain of 10 pounds or more in the last 3 months?: No Has the patient had a decrease in food intake/or appetite?: No Does the patient have dental problems?: No Does the patient have eating habits or behaviors that may be indicators of an eating disorder including binging or inducing vomiting?: No Has the patient recently lost weight without trying?: No Has the patient been eating poorly because of a decreased appetite?: No Malnutrition Screening Tool Score: 0   Physical Exam: Physical Exam HENT:     Nose: Nose normal.  Eyes:     Pupils: Pupils are equal, round, and reactive to light.  Cardiovascular:     Rate and Rhythm: Normal rate and regular rhythm.     Pulses: Normal pulses.  Pulmonary:     Effort: Pulmonary effort is normal.  Neurological:     Mental Status: He is alert and oriented to person, place, and time.  Psychiatric:        Attention and Perception: Attention and perception normal.        Mood and Affect: Mood and affect normal.        Speech: Speech normal.        Behavior: Behavior normal.        Thought Content: Thought content is paranoid. Thought content is not delusional. Thought content does not include suicidal ideation.        Cognition and Memory: Cognition and memory normal.        Judgment: Judgment normal.   Review of Systems  Eyes: Negative.  Respiratory: Negative.    Cardiovascular: Negative.   Genitourinary: Negative.   Skin: Negative.   Neurological: Negative.   Endo/Heme/Allergies: Negative.   Psychiatric/Behavioral:  Positive for depression. Negative for hallucinations and suicidal ideas. The patient is nervous/anxious.   All other systems reviewed and are negative. Blood pressure  120/70, pulse 80, temperature 98.3 F (36.8 C), temperature source Oral, resp. rate 16, SpO2 100 %. There is no height or weight on file to calculate BMI.  Musculoskeletal: Strength & Muscle Tone: within normal limits Gait & Station: normal Patient leans: N/A   [facility] MSE Discharge Disposition for Follow up and Recommendations: Based on my evaluation the patient does not appear to have an emergency medical condition and can be discharged with resources and follow up care in outpatient services for Medication Management, Partial Hospitalization Program, Individual Therapy, and Group Therapy   Oneta Rack, NP 08/08/2020, 10:20 AM

## 2020-08-08 NOTE — Progress Notes (Signed)
Patient presented to the [facility] with his wife.  Patient states that a thought came in his head yesterday about dying while he was keeping his son and it scared him so much he had to take his son to stay with a family member. He states that he is not suicidal and states that he has no plan to kill himself and states that he has never made any attempts to harm himself.  Patient states that he does not feel like he is depressed, but states that he feels like he may have anxiety and obsessive thoughts.  He states that the thoughts of dying yesterday and this morning made him throw up and he states that he woke up this morning throwing up.  Patient states that he has no stressors that should be making him feel this way, he states that his job and his marriage are good and he states that they just moved into a new house.  Patient denies HI/Psychosis, but admits to daily use of marijuana,  Patient states that his sleep and appetite have been good.  There is a gun in the house and it was recommended that the wife remove the gun as a precautionary measure.  Patient does not currently feel like he would hurt himself or anyone else.  Patient currently has no mental health provider and is not on any medications.

## 2020-08-08 NOTE — ED Notes (Signed)
Pt discharged with resources and recommendations for follow up care provided. Safety maintained.

## 2020-08-08 NOTE — Discharge Instructions (Addendum)
Take all medications as prescribed. Keep all follow-up appointments as scheduled.  Do not consume alcohol or use illegal drugs while on prescription medications. Report any adverse effects from your medications to your primary care provider promptly.  In the event of recurrent symptoms or worsening symptoms, call 911, a crisis hotline, or go to the nearest emergency department for evaluation.   

## 2020-08-10 ENCOUNTER — Telehealth (HOSPITAL_COMMUNITY): Payer: Self-pay | Admitting: Psychiatry

## 2020-08-10 NOTE — Telephone Encounter (Signed)
D:  Hillery Jacks, NP referred pt to MH-IOP.  A:  Placed call to orient pt and provide him with a start date.  Pt stopped cm before she could orient.  According to pt, he has an appt with [facility] on 08-12-20.  Encouraged pt to call case manager back if she changes his mind about MH-IOP.  Inform Tanika.  R:  Pt receptive.

## 2020-08-12 ENCOUNTER — Ambulatory Visit (HOSPITAL_COMMUNITY)
Admission: EM | Admit: 2020-08-12 | Discharge: 2020-08-13 | Disposition: A | Discharge status: Other Acute Inpatient Hospital | Payer: 59

## 2020-08-12 ENCOUNTER — Other Ambulatory Visit: Payer: Self-pay

## 2020-08-12 DIAGNOSIS — F410 Panic disorder [episodic paroxysmal anxiety] without agoraphobia: Secondary | ICD-10-CM

## 2020-08-12 DIAGNOSIS — F411 Generalized anxiety disorder: Secondary | ICD-10-CM

## 2020-08-13 ENCOUNTER — Emergency Department (EMERGENCY_DEPARTMENT_HOSPITAL)
Admission: EM | Admit: 2020-08-13 | Discharge: 2020-08-13 | Disposition: A | Discharge status: Other Acute Inpatient Hospital | Payer: 59 | Source: Home / Self Care | Attending: Emergency Medicine | Admitting: Emergency Medicine

## 2020-08-13 ENCOUNTER — Encounter (HOSPITAL_COMMUNITY): Payer: Self-pay

## 2020-08-13 ENCOUNTER — Other Ambulatory Visit: Payer: Self-pay

## 2020-08-13 ENCOUNTER — Inpatient Hospital Stay (HOSPITAL_COMMUNITY)
Admission: AD | Admit: 2020-08-13 | Discharge: 2020-08-18 | DRG: 880 | Disposition: A | Discharge status: Home / Self Care | Payer: 59 | Source: Intra-hospital | Attending: Psychiatry | Admitting: Psychiatry

## 2020-08-13 ENCOUNTER — Encounter (HOSPITAL_COMMUNITY): Payer: Self-pay | Admitting: Emergency Medicine

## 2020-08-13 DIAGNOSIS — F411 Generalized anxiety disorder: Principal | ICD-10-CM | POA: Diagnosis present

## 2020-08-13 DIAGNOSIS — F419 Anxiety disorder, unspecified: Secondary | ICD-10-CM | POA: Diagnosis not present

## 2020-08-13 DIAGNOSIS — F32A Depression, unspecified: Secondary | ICD-10-CM | POA: Diagnosis present

## 2020-08-13 DIAGNOSIS — R45851 Suicidal ideations: Secondary | ICD-10-CM | POA: Insufficient documentation

## 2020-08-13 DIAGNOSIS — R197 Diarrhea, unspecified: Secondary | ICD-10-CM | POA: Diagnosis not present

## 2020-08-13 DIAGNOSIS — F410 Panic disorder [episodic paroxysmal anxiety] without agoraphobia: Secondary | ICD-10-CM

## 2020-08-13 DIAGNOSIS — Z87891 Personal history of nicotine dependence: Secondary | ICD-10-CM | POA: Insufficient documentation

## 2020-08-13 DIAGNOSIS — R45850 Homicidal ideations: Secondary | ICD-10-CM | POA: Diagnosis not present

## 2020-08-13 DIAGNOSIS — F1994 Other psychoactive substance use, unspecified with psychoactive substance-induced mood disorder: Secondary | ICD-10-CM | POA: Diagnosis present

## 2020-08-13 DIAGNOSIS — F22 Delusional disorders: Secondary | ICD-10-CM | POA: Insufficient documentation

## 2020-08-13 DIAGNOSIS — Z20822 Contact with and (suspected) exposure to covid-19: Secondary | ICD-10-CM | POA: Insufficient documentation

## 2020-08-13 DIAGNOSIS — F1220 Cannabis dependence, uncomplicated: Secondary | ICD-10-CM | POA: Diagnosis present

## 2020-08-13 DIAGNOSIS — Z87442 Personal history of urinary calculi: Secondary | ICD-10-CM | POA: Diagnosis not present

## 2020-08-13 DIAGNOSIS — F1924 Other psychoactive substance dependence with psychoactive substance-induced mood disorder: Secondary | ICD-10-CM | POA: Diagnosis present

## 2020-08-13 LAB — COMPREHENSIVE METABOLIC PANEL
ALT: 17 U/L (ref 0–44)
AST: 24 U/L (ref 15–41)
Albumin: 4.7 g/dL (ref 3.5–5.0)
Alkaline Phosphatase: 52 U/L (ref 38–126)
Anion gap: 10 (ref 5–15)
BUN: 15 mg/dL (ref 6–20)
CO2: 24 mmol/L (ref 22–32)
Calcium: 9.4 mg/dL (ref 8.9–10.3)
Chloride: 104 mmol/L (ref 98–111)
Creatinine, Ser: 1.09 mg/dL (ref 0.61–1.24)
GFR, Estimated: >60 mL/min (ref >60)
Glucose, Bld: 82 mg/dL (ref 70–99)
Potassium: 4.3 mmol/L (ref 3.5–5.1)
Sodium: 138 mmol/L (ref 135–145)
Total Bilirubin: 1.7 mg/dL — ABNORMAL HIGH (ref 0.3–1.2)
Total Protein: 7.1 g/dL (ref 6.5–8.1)

## 2020-08-13 LAB — LIPID PANEL
Cholesterol: 168 mg/dL (ref 0–200)
HDL: 40 mg/dL — ABNORMAL LOW (ref >40)
LDL Cholesterol: 121 mg/dL — ABNORMAL HIGH (ref 0–99)
Total CHOL/HDL Ratio: 4.2 RATIO
Triglycerides: 35 mg/dL (ref <150)
VLDL: 7 mg/dL (ref 0–40)

## 2020-08-13 LAB — HEMOGLOBIN A1C
Hgb A1c MFr Bld: 5.3 % (ref 4.8–5.6)
Mean Plasma Glucose: 105.41 mg/dL

## 2020-08-13 LAB — RAPID URINE DRUG SCREEN, HOSP PERFORMED
Amphetamines: NOT DETECTED
Barbiturates: NOT DETECTED
Benzodiazepines: NOT DETECTED
Cocaine: NOT DETECTED
Opiates: NOT DETECTED
Tetrahydrocannabinol: POSITIVE — AB

## 2020-08-13 LAB — RESP PANEL BY RT-PCR (FLU A&B, COVID) ARPGX2
Influenza A by PCR: NEGATIVE
Influenza B by PCR: NEGATIVE
SARS Coronavirus 2 by RT PCR: NEGATIVE

## 2020-08-13 LAB — SALICYLATE LEVEL: Salicylate Lvl: <7 mg/dL — ABNORMAL LOW (ref 7.0–30.0)

## 2020-08-13 LAB — TSH: TSH: 0.96 u[IU]/mL (ref 0.350–4.500)

## 2020-08-13 LAB — ETHANOL: Alcohol, Ethyl (B): <10 mg/dL (ref <10)

## 2020-08-13 LAB — ACETAMINOPHEN LEVEL: Acetaminophen (Tylenol), Serum: <10 ug/mL — ABNORMAL LOW (ref 10–30)

## 2020-08-13 MED ORDER — TRAZODONE HCL 50 MG PO TABS
50.0000 mg | ORAL_TABLET | Freq: Every evening | ORAL | Status: DC | PRN
  Filled 2020-08-13: qty 1

## 2020-08-13 MED ORDER — ALUM & MAG HYDROXIDE-SIMETH 200-200-20 MG/5ML PO SUSP: 30.0000 mL | Freq: Four times a day (QID) | ORAL | Status: DC | PRN

## 2020-08-13 MED ORDER — QUETIAPINE FUMARATE 25 MG PO TABS
25.0000 mg | ORAL_TABLET | Freq: Every day | ORAL | Status: DC
  Administered 2020-08-13 – 2020-08-14 (×2): 25 mg via ORAL
  Filled 2020-08-13 (×4): qty 1

## 2020-08-13 MED ORDER — LORAZEPAM 1 MG PO TABS
1.0000 mg | ORAL_TABLET | Freq: Once | ORAL | Status: AC
  Administered 2020-08-13: 1 mg via ORAL
  Filled 2020-08-13: qty 1

## 2020-08-13 MED ORDER — ACETAMINOPHEN 325 MG PO TABS
650.0000 mg | ORAL_TABLET | Freq: Four times a day (QID) | ORAL | Status: DC | PRN
Start: 2020-08-13 — End: 2020-08-13

## 2020-08-13 MED ORDER — ALUM & MAG HYDROXIDE-SIMETH 200-200-20 MG/5ML PO SUSP: 30.0000 mL | ORAL | Status: DC | PRN

## 2020-08-13 MED ORDER — MAGNESIUM HYDROXIDE 400 MG/5ML PO SUSP: 30.0000 mL | Freq: Every day | ORAL | Status: DC | PRN

## 2020-08-13 MED ORDER — ONDANSETRON HCL 4 MG PO TABS: 4.0000 mg | ORAL_TABLET | Freq: Three times a day (TID) | ORAL | Status: DC | PRN

## 2020-08-13 MED ORDER — TRAZODONE HCL 50 MG PO TABS: 50.0000 mg | ORAL_TABLET | Freq: Every evening | ORAL | Status: DC | PRN

## 2020-08-13 MED ORDER — ACETAMINOPHEN 325 MG PO TABS: 650.0000 mg | ORAL_TABLET | Freq: Four times a day (QID) | ORAL | Status: DC | PRN

## 2020-08-13 MED ORDER — ONDANSETRON 4 MG PO TBDP: 4.0000 mg | ORAL_TABLET | Freq: Three times a day (TID) | ORAL | Status: DC | PRN

## 2020-08-13 MED ORDER — ESCITALOPRAM OXALATE 10 MG PO TABS
10.0000 mg | ORAL_TABLET | Freq: Every day | ORAL | Status: DC
  Administered 2020-08-13: 10 mg via ORAL
  Filled 2020-08-13: qty 1

## 2020-08-13 MED ORDER — IBUPROFEN 200 MG PO TABS: 600.0000 mg | ORAL_TABLET | Freq: Three times a day (TID) | ORAL | Status: DC | PRN

## 2020-08-13 MED ORDER — QUETIAPINE FUMARATE 25 MG PO TABS
25.0000 mg | ORAL_TABLET | Freq: Every day | ORAL | Status: DC
  Filled 2020-08-13: qty 1

## 2020-08-13 MED ORDER — LORAZEPAM 2 MG/ML IJ SOLN
1.0000 mg | Freq: Once | INTRAMUSCULAR | Status: AC
  Administered 2020-08-13: 1 mg via INTRAMUSCULAR
  Filled 2020-08-13: qty 1

## 2020-08-13 NOTE — Plan of Care (Signed)
Newly admitted. Cooperative but continues to endorse passive SI with remorse. Contracts for safety. Reports that he has not been taking medications. Willing to participate in treatment and wanting to get better. Reports that he has support, people to live for. Has no additional concerns.

## 2020-08-13 NOTE — ED Triage Notes (Signed)
Pt has been having SI/HI and AVH, went to [facility] and told to come here for overnight obs due to no beds.

## 2020-08-13 NOTE — Progress Notes (Signed)
Patient ID: Todd Moon, male   DOB: 06-05-1995, 25 y.o.   MRN: [id number] Patient presents involuntarily secondary to increased anxiety, suicidal and homicidal thoughts. This started with increased homicidal thoughts toward his 105 year-old baby, then towards other different people. Patient Was unable to fight the thoughts, then became suicidal as well. He reports that this is the first time he experiences this. He reports that this may be related to use of Marijuana. He denies use of any other drugs but admits that he has used cocaine and mushrooms in the past. He reports history of mental illnesses in the family "my sister and my mother take Prozac". He denies hx of abuse. He denies financial difficulties. Patient lives with his fiancee and their 84 year-old baby. He is currently employed fulltime. His fiancee is employed as well.  Patient reports that "I want the thoughts to go away, I don't want to kill my baby, I love my family".  Patient was cooperative with assessment. Skin assessment revealed no issues. Patient was admitted and oriented to the unit. Safety precautions initiated.

## 2020-08-13 NOTE — BH Assessment (Addendum)
Comprehensive Clinical Assessment (CCA) Note  08/13/2020 Baron Parmelee [id number] Disposition: Pt was seen at [facility] by this clinician and Melbourne Abts, PA.  He performed the MSE.  He recommends inpatient psychiatric care.  There are no adult beds at [facility] tonight.  Patient is not eligible for [facility] [place].  Patient will be going to [facility] until placement is found.  Pt looks tired and discheveled.  Pt is not responding to internal stimuli.  He does not evidence any delusional thought processes.  Patient can express himself clearly and concisely when redirected.  Pt is finding the panic attacks and intrusive thoughts to be debilitating.  It is affecting his quality of life.  Patient does not trust himself.    Patient has no current outpatient provider.  He has never had any inpatient care history.     Chief Complaint:  Chief Complaint  Patient presents with   Suicidal   Visit Diagnosis: Generalized Anxiety D/O    CCA Screening, Triage and Referral (STR)  Patient Reported Information How did you hear about [place]? Self (Pt is accompanied by fiance Rolanda Jay)  What Is the Reason for Your Visit/Call Today? Pt was brought to [facility] by girlfriend.  He is still having anxiety over toughts of killing other people.  He has no intention of actually killing anyone and has no plans to do so.  He will having racing thoughts of harming others and this causes him to throw up.  His symptoms include the raciing thoughts, nausea, vomiting, biting fingernails, lack of appetite, restless, talking escessively, blurry vision.  Patient says he has had some degree of anxiety younger age.  Pt says he has had these prolonged panic attacks since this past Friday (07/29).  Pt says that he "has a bad temper but not physically violent."  Pt says that he has had some SI but no plan or intention.  Patient never has had any previous attempts.  Pt has had some thoughts of "easily hurting someone" but I have  no "intention or plan and never will."  Pt was seen here on 07/30.  He did go to [facility] last night (08.02) and was seen via telehealth by two psychiatrists.  The psychiatrist arranged for him to go to [facility].  He was there at [facility] today around before 17:30 and waited for about 3 hours and still did not get seen.  Pt and fiance left [facility] and they set them up with a telehealth appointment for Friday (08/05) at 19:30/  Patient and fiance decided to come back to [facility].  Pt says he used to use marijuana daily and woul dsmoke multiple bowls per day.  he has not had anymore since Friday, July 29.  Pt says tha the was given hydroxyzine when he came to [facility] on 07/30.  He says he used it on Saturday and he says he took one on Sunday after work.  He says it did not help much  How Long Has This Been Causing You Problems? <Week  What Do You Feel Would Help You the Most Today? Treatment for Depression or other mood problem   Have You Recently Had Any Thoughts About Hurting Yourself? Yes  Are You Planning to Commit Suicide/Harm Yourself At This time? No   Have you Recently Had Thoughts About Hurting Someone Karolee Ohs? Yes  Are You Planning to Harm Someone at This Time? No  Explanation: No data recorded  Have You Used  Any Alcohol or Drugs in the Past 24 Hours? No  How Long Ago Did You Use Drugs or Alcohol? No data recorded What Did You Use and How Much? Last use was yesterday   Do You Currently Have a Therapist/Psychiatrist? No  Name of Therapist/Psychiatrist: No data recorded  Have You Been Recently Discharged From Any Office Practice or Programs? No  Explanation of Discharge From Practice/Program: No data recorded    CCA Screening Triage Referral Assessment Type of Contact: Face-to-Face  Telemedicine Service Delivery:   Is this Initial or Reassessment? No data recorded Date Telepsych consult ordered in [facility]:  No data recorded Time Telepsych consult ordered in  [facility]:  No data recorded Location of Assessment: [facility] Assessment Services  Provider Location: [facility] Assessment Services   Collateral Involvement: Interior and spatial designer (fiance)   Does Patient Have a Automotive engineer Guardian? No data recorded Name and Contact of Legal Guardian: No data recorded If Minor and Not Living with Parent(s), Who has Custody? No data recorded Is CPS involved or ever been involved? Never  Is APS involved or ever been involved? Never   Patient Determined To Be At Risk for Harm To Self or Others Based on Review of Patient Reported Information or Presenting Complaint? Yes, for Self-Harm  Method: No data recorded Availability of Means: No data recorded Intent: No data recorded Notification Required: No data recorded Additional Information for Danger to Others Potential: No data recorded Additional Comments for Danger to Others Potential: No data recorded Are There Guns or Other Weapons in Your Home? No data recorded Types of Guns/Weapons: No data recorded Are These Weapons Safely Secured?                            No data recorded Who Could Verify You Are Able To Have These Secured: No data recorded Do You Have any Outstanding Charges, Pending Court Dates, Parole/Probation? No data recorded Contacted To Inform of Risk of Harm To Self or Others: No data recorded   Does Patient Present under Involuntary Commitment? No  IVC Papers Initial File Date: No data recorded  [place] of Residence: Other (Comment) ([place])   Patient Currently Receiving the Following Services: Not Receiving Services   Determination of Need: Urgent (48 hours)   Options For Referral: Inpatient Hospitalization     CCA Biopsychosocial Patient Reported Schizophrenia/Schizoaffective Diagnosis in Past: No   Strengths: Hunting, runnign heavy equipment   Mental Health Symptoms Depression:   Difficulty Concentrating; Hopelessness   Duration of Depressive symptoms:   Duration of Depressive Symptoms: Less than two weeks   Mania:   Racing thoughts; Change in energy/activity   Anxiety:    Difficulty concentrating; Restlessness; Tension; Worrying   Psychosis:   None   Duration of Psychotic symptoms:    Trauma:   Avoids reminders of event; Guilt/shame   Obsessions:   Cause anxiety; Intrusive/time consuming   Compulsions:   None   Inattention:   Disorganized   Hyperactivity/Impulsivity:   Fidgets with hands/feet   Oppositional/Defiant Behaviors:   None   Emotional Irregularity:   Potentially harmful impulsivity   Other Mood/Personality Symptoms:  No data recorded   Mental Status Exam Appearance and self-care  Stature:   Average   Weight:   Average weight   Clothing:   Disheveled; Casual   Grooming:   Neglected (Has not showered since 08/02.)   Cosmetic use:   None   Posture/gait:   Normal  Motor activity:   Repetitive; Restless   Sensorium  Attention:   Distractible   Concentration:   Anxiety interferes   Orientation:   X5   Recall/memory:   Defective in Remote   Affect and Mood  Affect:   Congruent; Anxious   Mood:   Depressed; Anxious   Relating  Eye contact:   Normal   Facial expression:   Anxious   Attitude toward examiner:   Cooperative   Thought and Language  Speech flow:  Clear and Coherent   Thought content:   Appropriate to Mood and Circumstances   Preoccupation:   Suicide; Homicidal   Hallucinations:   None   Organization:  No data recorded  [employer] of Knowledge:   Average   Intelligence:   Average   Abstraction:   Normal   Judgement:   Impaired   Reality Testing:   Adequate   Insight:   Fair   Decision Making:   Impulsive   Social Functioning  Social Maturity:   Impulsive   Social Judgement:   Normal   Stress  Stressors:   Other (Comment) (Pt cannot identify any stressors.)   Coping Ability:   Overwhelmed   Skill  Deficits:   Decision making   Supports:   Family; Friends/Service system     Religion:    Leisure/Recreation:    Exercise/Diet:     CCA Employment/Education Employment/Work Situation:    Education:     CCA Family/Childhood History Family and Relationship History:    Childhood History:     Child/Adolescent Assessment:     CCA Substance Use Alcohol/Drug Use:                           ASAM's:  Six Dimensions of Multidimensional Assessment  Dimension 1:  Acute Intoxication and/or Withdrawal Potential:      Dimension 2:  Biomedical Conditions and Complications:      Dimension 3:  Emotional, Behavioral, or Cognitive Conditions and Complications:     Dimension 4:  Readiness to Change:     Dimension 5:  Relapse, Continued use, or Continued Problem Potential:     Dimension 6:  Recovery/Living Environment:     ASAM Severity Score:    ASAM Recommended Level of Treatment:     Substance use Disorder (SUD)    Recommendations for Services/Supports/Treatments:    Discharge Disposition:    DSM5 Diagnoses: Patient Active Problem List   Diagnosis Date Noted   Laceration of finger of left hand, subsequent encounter 05/07/2015   Tremor 07/26/2012     Referrals to Alternative Service(s): Referred to Alternative Service(s):   Place:   Date:   Time:    Referred to Alternative Service(s):   Place:   Date:   Time:    Referred to Alternative Service(s):   Place:   Date:   Time:    Referred to Alternative Service(s):   Place:   Date:   Time:     Wandra Mannan

## 2020-08-13 NOTE — Progress Notes (Signed)
[facility] Group Notes:  (Nursing/MHT/Case Management/Adjunct)  Date:  08/13/2020  Time:  2015  Type of Therapy:   wrap up group  Participation Level:  Active  Participation Quality:  Appropriate, Attentive, Sharing, and Supportive  Affect:  Anxious and Depressed  Cognitive:  Alert  Insight:  Improving  Engagement in Group:  Engaged  Modes of Intervention:  Clarification, Education, and Support  Summary of Progress/Problems: Positive thinking and self-care were discussed.   Johann Capers S 08/13/2020, 9:09 PM

## 2020-08-13 NOTE — Discharge Instructions (Signed)
Patient to be transferred to [facility] ED for further observation/safety monitoring.

## 2020-08-13 NOTE — ED Notes (Signed)
Report called to [facility] RN. [facility] arrives. Pt steady gait to exit with NT. Belongings and packet given to [employer]. Pt VSS and no distress on dc.

## 2020-08-13 NOTE — ED Provider Notes (Signed)
[facility] EMERGENCY DEPARTMENT Provider Note   CSN: [id number] Arrival date & time: 08/13/20  0148     History Chief Complaint  Patient presents with   Suicidal    Ival Pacer is a 25 y.o. male with a h/o ADHD who presents to the emergency department from [facility] for continued observation. Per conversation with Melbourne Abts, PA-C, and chart review, patient's was initially recommended for overnight observation, but is unable to remain overnight at [facility] as patient does not meet their observation criteria since he is from out of [place].  There were no beds available at [facility] so he was transferred to [facility], ED for further observation and safety monitoring.  The patient states that he presented to [facility] for homicidal ideation.  He reports that he has been having increasingly worrisome homicidal thoughts toward various individuals over the last few days.  He states that his symptoms began a few days ago after he saw a knife laying in the kitchen.  He began to have thoughts of using the knife to stab his young son.  He states that he became so concerned that he might harm his son that he had his significant other have their son stay at another family member's home for the last few days.  He reports that he has had difficulty performing his tasks at work over the last few days due to worsening intrusive thoughts of harming others.  He reports that he has been seeing visions of guns in his head.  Due to worsening thoughts of harming others, the patient states that he now is having thoughts of wanting to harm or kill himself to avoid hurting anyone else.  He denies having a specific plan to harm himself.  He denies auditory or visual hallucinations.  Since onset of his symptoms, he went to [facility] ED where he was kept overnight and evaluated by psychiatry.  He then followed up and had another outpatient psychiatry appointment earlier today.  He was started on Vistaril,  which did not help with his symptoms. Due to continue, worsening concern, his significant other drove him from [facility], where they reside, to [facility] after researching the facility online to see if he can get help.  He reports that there are multiple guns in the home, but they have been locked up due to his worsening symptoms.  No history of previous psychiatric hospitalizations.  He denies fever, chills, chest pain, shortness of breath, nausea, vomiting, diarrhea, headache, visual changes, or other complaints.   He endorses marijuana use.  He denies tobacco use.  No other illicit or recreational substance use.  He reports social alcohol use.  No alcohol use over the last few days.   The history is provided by the patient and medical records. No language interpreter was used.      Past Medical History:  Diagnosis Date   Kidney stones     Patient Active Problem List   Diagnosis Date Noted   Laceration of finger of left hand, subsequent encounter 05/07/2015   Tremor 07/26/2012    Past Surgical History:  Procedure Laterality Date   TONSILLECTOMY     TYMPANOSTOMY TUBE PLACEMENT         Family History  Problem Relation Age of Onset   Healthy Mother    Healthy Father     Social History   Tobacco Use   Smoking status: Never   Smokeless tobacco: Former    Types: Sports administrator  Quit date: 09/01/2018  Vaping Use   Vaping Use: Never used  Substance Use Topics   Alcohol use: Yes    Comment: social   Drug use: Never    Home Medications Prior to Admission medications   Medication Sig Start Date End Date Taking? Authorizing Provider  hydrOXYzine (ATARAX/VISTARIL) 25 MG tablet Take 1 tablet (25 mg total) by mouth 3 (three) times daily as needed. Patient taking differently: Take 25 mg by mouth 3 (three) times daily as needed for anxiety. 08/08/20  Yes Oneta Rack, NP  pantoprazole (PROTONIX) 20 MG tablet Take 1 tablet (20 mg total) by mouth daily. 05/09/17 09/01/19  Lutricia Feil, PA-C    Allergies    Patient has no known allergies.  Review of Systems   Review of Systems  Constitutional:  Negative for appetite change and fever.  HENT:  Negative for congestion and sore throat.   Respiratory:  Negative for cough, shortness of breath and wheezing.   Cardiovascular:  Negative for chest pain and palpitations.  Gastrointestinal:  Negative for abdominal pain, diarrhea, nausea and vomiting.  Genitourinary:  Negative for dysuria.  Musculoskeletal:  Negative for back pain.  Skin:  Negative for rash.  Allergic/Immunologic: Negative for immunocompromised state.  Neurological:  Negative for seizures, syncope, weakness, numbness and headaches.  Psychiatric/Behavioral:  Positive for behavioral problems, sleep disturbance and suicidal ideas. Negative for confusion, hallucinations and self-injury. The patient is not nervous/anxious and is not hyperactive.        HI   Physical Exam Updated Vital Signs BP (!) 138/93   Pulse 94   Temp 98.3 F (36.8 C) (Oral)   Resp 20   SpO2 100%   Physical Exam Vitals and nursing note reviewed.  Constitutional:      General: He is not in acute distress.    Appearance: He is well-developed. He is not ill-appearing or toxic-appearing.  HENT:     Head: Normocephalic.  Eyes:     Conjunctiva/sclera: Conjunctivae normal.  Cardiovascular:     Rate and Rhythm: Normal rate and regular rhythm.     Heart sounds: No murmur heard. Pulmonary:     Effort: Pulmonary effort is normal. No respiratory distress.     Breath sounds: No stridor. No wheezing, rhonchi or rales.  Chest:     Chest wall: No tenderness.  Abdominal:     General: There is no distension.     Palpations: Abdomen is soft. There is no mass.     Tenderness: There is no abdominal tenderness. There is no right CVA tenderness, left CVA tenderness, guarding or rebound.     Hernia: No hernia is present.  Musculoskeletal:     Cervical back: Neck supple.  Skin:    General: Skin  is warm and dry.  Neurological:     Mental Status: He is alert.  Psychiatric:        Mood and Affect: Mood is anxious. Affect is tearful.        Speech: Speech normal.        Behavior: Behavior is withdrawn. Behavior is cooperative.        Thought Content: Thought content is paranoid. Thought content includes homicidal and suicidal ideation.     Comments: Tearful in triage when discussing his symptoms.  He is very fearful of harming someone.  Anxious appearing.    ED Results / Procedures / Treatments   Labs (all labs ordered are listed, but only abnormal results are displayed) Labs Reviewed  RESP PANEL BY RT-PCR (FLU A&B, COVID) [id number]    EKG None  Radiology No results found.  Procedures Procedures   Medications Ordered in ED Medications  ibuprofen (ADVIL) tablet 600 mg (has no administration in time range)  ondansetron (ZOFRAN) tablet 4 mg (has no administration in time range)  alum & mag hydroxide-simeth (MAALOX/MYLANTA) 200-200-20 MG/5ML suspension 30 mL (has no administration in time range)  LORazepam (ATIVAN) injection 1 mg (1 mg Intramuscular Given 08/13/20 0418)    ED Course  I have reviewed the triage vital signs and the nursing notes.  Pertinent labs & imaging results that were available during my care of the patient were reviewed by me and considered in my medical decision making (see chart for details).    MDM Rules/Calculators/A&P                           This is a 25 year old male with a history of ADHD and no history of previous inpatient behavioral hospitalizations who presents to the emergency department from [facility] for continued stabilization.  The patient has infrequent ED use, but over the last few days has been seen at [facility] and at another ED. he also adds that he followed up with an outpatient psychiatrist earlier in the day between these visits due to concern for his symptoms.   Vital signs are stable.  The patient had labs drawn at [facility] ED on  8/2, which I have reviewed.  UA positive for THC.  No other acute findings.  COVID-19 test was obtained today in the ED and is negative.  I reviewed the patient's note from [facility].  Initial plan was for overnight stabilization and reevaluation by psychiatry in the a.m.  After the evaluating the patient in triage, I contacted Melbourne Abts, PA-C with psychiatry to express concern about this patient's disposition as I believe that he would strongly benefit from inpatient treatment.  This is a patient who, as previously mentioned, does not frequent the ED and has no patient of inpatient behavioral health admissions.  He has become increasingly concerned about his symptoms to the point where he had his young son taken out of the home due to concern for harming his life.  The patient has tried multiple avenues to seek help for his symptoms unsuccessfully.  He was discharged with Vistaril, which he has taken as prescribed, with no improvement.  I strongly feel that the next step is for inpatient behavioral health admission for stabilization.  I did have a long discussion with the patient at bedside about what his hopes were for his ED visit today.  He expressed that he would like to start medication to help manage his symptoms, but I discussed that most of the medications that he would be started on would take time before they would start working and the medications may need to be adjusted.  After discussing inpatient treatment, patient was agreeable.  At this time, behavioral health has changed his disposition to inpatient.  He remains voluntary at this time and is in agreement with the plan.  However, if he became uncooperative or attempted to leave, I think he would benefit from IVC.  Pt medically cleared at this time. Psych hold orders and home med orders placed. TTS consult pending; please see psych team notes for further documentation of care. Pt stable at time of med clearance.     Final Clinical Impression(s)  / ED Diagnoses Final diagnoses:  [facility] (  [facility]  Homicidal ideations    Rx / DC Orders ED Discharge Orders     None        Barkley BoardsMcDonald, Rateel Beldin A, PA-C 08/13/20 0445    Glynn Octaveancour, Stephen, MD 08/13/20 [phone]

## 2020-08-13 NOTE — ED Notes (Signed)
Pt clothes placed in locker #3, Pt cell phone , wallet and charger placed in envelope and given to security for safe.
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
## 2020-08-13 NOTE — Progress Notes (Signed)
   08/13/20 2148  Psych Admission Type (Psych Patients Only)  Admission Status Voluntary  Psychosocial Assessment  Patient Complaints Anxiety;Sadness;Depression  Eye Contact Fair  Facial Expression Sullen;Sad;Worried  Affect Appropriate to circumstance;Sad;Sullen  Speech Logical/coherent  Interaction Forwards little;Submissive  Motor Activity Other (Comment) (WDL)  Appearance/Hygiene Unremarkable  Behavior Characteristics Cooperative;Appropriate to situation  Mood Depressed;Anxious;Sullen;Sad  Thought Process  Coherency WDL  Content Blaming self  Delusions None reported or observed  Perception WDL  Hallucination None reported or observed  Judgment Poor  Confusion None  Danger to Self  Current suicidal ideation? Passive  Self-Injurious Behavior No self-injurious ideation or behavior indicators observed or expressed   Agreement Not to Harm Self Yes  Description of Agreement Verbal contract  Danger to Others  Danger to Others None reported or observed

## 2020-08-13 NOTE — Progress Notes (Signed)
Patient information has been sent to [facility] via secure chat to review for potential admission. Patient meets inpatient criteria per Melbourne Abts, PA-C.   Situation ongoing, CSW will continue to monitor progress.    Signed:  Damita Dunnings, MSW, LCSW-A  08/13/2020 8:35 AM

## 2020-08-13 NOTE — Consult Note (Signed)
[facility] Face-to[facility]Face [facility] Consult   Reason for Consult: For placement to inpatient psychiatry Hospital Referring Physician:  [place] Abtsody Taylor PA Patient Identification: Todd LovelyDustin Moon MRN:  [id number] Principal Diagnosis: GAD (generalized anxiety disorder) Diagnosis:  Principal Problem:   GAD (generalized anxiety disorder)   Total Time spent with patient: 20 minutes  Subjective:  Pt is seen and examined today. Pt states his mood is tired and anxious. Pt rates his mood at 5/10 (10 is the best mood). Pt states he hasn't slept for 2-3 days but slept little bit after taking medications last night. Pt states his appetite is poor. Pt states he is still very anxious and worried about all these intrusive thoughts.  Currently, he endorses intrusive thoughts of hurting himself and others.  He denies any plan or intent. He is not able to contract for safety states he is a good boy and he knows that he will not do that but he is really anxious and worried about these thoughts. Currently, he denies visual and auditory hallucination.  He reports nausea today. Patient safety states he had dizziness, nausea and vomiting yesterday but it is getting better.  Pt denies any medication side effects and has been tolerating it well.  He states he used hydroxyzine in the past which has made him drowsy.  He states he does not want to start that again. HPI:  Todd Moon is a 25 y.o. male patient admitted with history of generalized anxiety disorder who [facility] for "bad thoughts".  Patient reports having intrusive negative thoughts of hurting himself and others since he used marijuana on 08/07/2020.  Patient [facility] ED [facility] [place]  Patient is seen and examined again this morning.  Patient continues to meet criteria for inpatient hospitalization.  CSW to help with inpatient psychiatry hospital admission.  Past Psychiatric  History: GAD, See H&P  Risk to Self: Yes  (Patient denies SI currently on exam, but endorses having consistent thoughts of killing himself over the past week.) Risk to Others:  Yes(Patient denies HI currently on exam, but endorses having consistent thoughts of killing/harming others over the past week.) Prior Inpatient Therapy:  No Prior Outpatient Therapy:  No  Past Medical History:  Past Medical History:  Diagnosis Date   Kidney stones     Past Surgical History:  Procedure Laterality Date   TONSILLECTOMY     TYMPANOSTOMY TUBE PLACEMENT     Family History:  Family History  Problem Relation Age of Onset   Healthy Mother    Healthy Father    Family Psychiatric  History: see H&P Social History:  Social History   Substance and Sexual Activity  Alcohol Use Yes   Comment: social     Social History   Substance and Sexual Activity  Drug Use Never    Social History   Socioeconomic History   Marital status: Single    Spouse name: Not on file   Number of children: Not on file   Years of education: Not on file   Highest education level: Not on file  Occupational History   Not on file  Tobacco Use   Smoking status: Never   Smokeless tobacco: Former    Types: Chew    Quit date: 09/01/2018  Vaping Use   Vaping Use: Never used  Substance and Sexual Activity   Alcohol use: Yes    Comment: social   Drug  use: Never   Sexual activity: Not on file  Other Topics Concern   Not on file  Social History Narrative   Not on file   Social Determinants of Health   Financial Resource Strain: Not on file  Food Insecurity: Not on file  Transportation Needs: Not on file  Physical Activity: Not on file  Stress: Not on file  Social Connections: Not on file   Additional Social History:    Allergies:  No Known Allergies  Labs:  Results for orders placed or performed during the hospital encounter of 08/13/20 (from the past 48 hour(s))  Resp Panel by RT-PCR (Flu A&B, Covid)  Nasopharyngeal Swab     Status: None   Collection Time: 08/13/20  2:32 AM   Specimen: Nasopharyngeal Swab; Nasopharyngeal(NP) swabs in vial transport medium  Result Value Ref Range   SARS Coronavirus 2 by RT PCR NEGATIVE NEGATIVE    Comment: (NOTE) SARS-CoV-2 target nucleic acids are NOT DETECTED.  The SARS-CoV-2 RNA is generally detectable in upper respiratory specimens during the acute phase of infection. The lowest concentration of SARS-CoV-2 viral copies this assay can detect is 138 copies/mL. A negative result does not preclude SARS-Cov-2 infection and should not be used as the sole basis for treatment or other patient management decisions. A negative result may occur with  improper specimen collection/handling, submission of specimen other than nasopharyngeal swab, presence of viral mutation(s) within the areas targeted by this assay, and inadequate number of viral copies(<138 copies/mL). A negative result must be combined with clinical observations, patient history, and epidemiological information. The expected result is Negative.  Fact Sheet for Patients:  BloggerCourse.com  Fact Sheet for Healthcare Providers:  SeriousBroker.it  This test is no t yet approved or cleared by the [place] FDA and  has been authorized for detection and/or diagnosis of SARS-CoV-2 by FDA under an Emergency Use Authorization (EUA). This EUA will remain  in effect (meaning this test can be used) for the duration of the COVID-19 declaration under Section 564(b)(1) of the Act, 21 U.S.C.section 360bbb-3(b)(1), unless the authorization is terminated  or revoked sooner.       Influenza A by PCR NEGATIVE NEGATIVE   Influenza B by PCR NEGATIVE NEGATIVE    Comment: (NOTE) The Xpert Xpress SARS-CoV-2/FLU/RSV plus assay is intended as an aid in the diagnosis of influenza from Nasopharyngeal swab specimens and should not be used as a sole basis for  treatment. Nasal washings and aspirates are unacceptable for Xpert Xpress SARS-CoV-2/FLU/RSV testing.  Fact Sheet for Patients: BloggerCourse.com  Fact Sheet for Healthcare Providers: SeriousBroker.it  This test is not yet approved or cleared by the [place] FDA and has been authorized for detection and/or diagnosis of SARS-CoV-2 by FDA under an Emergency Use Authorization (EUA). This EUA will remain in effect (meaning this test can be used) for the duration of the COVID-19 declaration under Section 564(b)(1) of the Act, 21 U.S.C. section 360bbb-3(b)(1), unless the authorization is terminated or revoked.  Performed at [facility], [address] [place], [place] [address]     Current Facility-Administered Medications  Medication Dose Route Frequency Provider Last Rate Last Admin   alum & mag hydroxide-simeth (MAALOX/MYLANTA) 200-200-20 MG/5ML suspension 30 mL  30 mL Oral Q6H PRN McDonald, Mia A, PA-C       ibuprofen (ADVIL) tablet 600 mg  600 mg Oral Q8H PRN McDonald, Mia A, PA-C       ondansetron (ZOFRAN) tablet 4 mg  4 mg Oral  Q8H PRN McDonald, Mia A, PA-C       QUEtiapine (SEROQUEL) tablet 25 mg  25 mg Oral QHS Karsten Ro, MD       traZODone (DESYREL) tablet 50 mg  50 mg Oral QHS PRN Jaclyn Shaggy, PA-C       Current Outpatient Medications  Medication Sig Dispense Refill   hydrOXYzine (ATARAX/VISTARIL) 25 MG tablet Take 1 tablet (25 mg total) by mouth 3 (three) times daily as needed. (Patient taking differently: Take 25 mg by mouth 3 (three) times daily as needed for anxiety.) 30 tablet 0    Musculoskeletal: Strength & Muscle Tone: within normal limits Gait & Station: normal Patient leans: N/A            Psychiatric Specialty Exam:  Presentation  General Appearance: Casual  Eye Contact:Good  Speech:Normal Rate  Speech Volume:Normal  Handedness:Right   Mood and Affect  Mood:Anxious;  Depressed  Affect:Congruent   Thought Process  Thought Processes:Coherent; Goal Directed; Linear  Descriptions of Associations:Intact  Orientation:Full (Time, Place and Person)  Thought Content:Rumination  History of Schizophrenia/Schizoaffective disorder:No  Duration of Psychotic Symptoms:N/A  Hallucinations:Hallucinations: None  Ideas of Reference:None  Suicidal Thoughts:Suicidal Thoughts: Yes, Passive (Ruminations and intrusive thoughts,) SI Passive Intent and/or Plan: Without Intent; Without Plan  Homicidal Thoughts:Homicidal Thoughts: Yes, Passive (Ruminations and intrusive thoughts) HI Passive Intent and/or Plan: Without Intent; Without Plan   Sensorium  Memory:Immediate Good; Recent Good; Remote Good  Judgment:Fair  Insight:Fair   Executive Functions  Concentration:Fair  Attention Span:Fair  Recall:Fair  Fund of Knowledge:Fair  Language:Fair   Psychomotor Activity  Psychomotor Activity:Psychomotor Activity: Increased; Restlessness   Assets  Assets:Communication Skills; Desire for Improvement; Financial Resources/Insurance; Housing; Leisure Time; Physical Health; Resilience; Social Support; Transportation; Vocational/Educational   Sleep  Sleep:Sleep: Fair Number of Hours of Sleep: 5   Physical Exam: Physical Exam Vitals and nursing note reviewed.  Constitutional:      General: He is in acute distress.     Appearance: Normal appearance. He is not ill-appearing, toxic-appearing or diaphoretic.  Pulmonary:     Effort: Pulmonary effort is normal.  Neurological:     General: No focal deficit present.     Mental Status: He is alert and oriented to person, place, and time.   Review of Systems  Constitutional:  Negative for chills and fever.  HENT:  Negative for hearing loss.   Eyes:  Negative for blurred vision.  Respiratory:  Negative for shortness of breath.   Cardiovascular:  Negative for chest pain.  Gastrointestinal:  Positive for  nausea.  Neurological:  Negative for dizziness and headaches.  Psychiatric/Behavioral:  Positive for depression, substance abuse and suicidal ideas. Negative for hallucinations. The patient is nervous/anxious.        Passive suicidal and homicidal thoughts-intrusive thoughts without any plan or intent  Blood pressure 127/77, pulse 80, temperature 98.5 F (36.9 C), temperature source Oral, resp. rate 14, SpO2 98 %. There is no height or weight on file to calculate BMI.  Treatment Plan Summary: Patient meets criteria for inpatient hospitalization.  Patient has passive suicidal and homicidal intrusive thoughts. Plan   -Stop Lexapro -Start Seroquel 25 mg nightly. -Continue trazodone 50 mg as needed nightly for insomnia. -Continue Zofran 4 mg every 8 hours as needed for nausea and vomiting. -Patient continues to meet criteria for inpatient psychiatry hospitalization.  -CSW to help with placement. -Labs and tests ordered- Cbc, CMP, TSH, U tox, ethanol level, acetaminophen levels, salicylate level, HbA1c, lipid panel and EKG  Disposition: Recommend psychiatric Inpatient admission when medically cleared. Patient continues to meet criteria for inpatient psychiatry hospitalization.   Karsten Ro, MD 08/13/2020 11:50 AM

## 2020-08-13 NOTE — ED Notes (Signed)
Pt belongings to [facility] to go to [facility]

## 2020-08-13 NOTE — H&P (Addendum)
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 Reference:None  Suicidal Thoughts: No  Homicidal Thoughts:Patient denies HI or any homicidal intent or specific homicidal plan, but does continue to endorse fleeting generalized thoughts of harming/killing others Sensorium  Memory:Immediate Good; Recent Good; Remote Good  Judgment:Fair  Insight:Fair   Executive Functions  Concentration:Fair  Attention Span:Fair  Recall:Good  Fund of Knowledge:Good  Language:Fair   Psychomotor Activity  Psychomotor Activity:Psychomotor Activity: Increased; Restlessness   Assets  Assets:Communication Skills; Desire for Improvement; Financial Resources/Insurance; Housing;  Leisure Time; Physical Health; Resilience; Social Support; Transportation; Vocational/Educational   Sleep  Sleep:Sleep: Fair Number of Hours of Sleep: 5    Physical Exam: Physical Exam Vitals reviewed.  Constitutional:      General: He is not in acute distress.    Appearance: He is not ill-appearing, toxic-appearing or diaphoretic.  HENT:     Head: Normocephalic and atraumatic.     Right Ear: External ear normal.     Left Ear: External ear normal.     Nose: Nose normal.  Eyes:     General:        Right eye: No discharge.        Left eye: No discharge.     Conjunctiva/sclera: Conjunctivae normal.  Cardiovascular:     Rate and Rhythm: Tachycardia present.  Pulmonary:     Effort: Pulmonary effort is normal. No respiratory distress.  Musculoskeletal:        General: Normal range of motion.     Cervical back: Normal range of motion.  Neurological:     General: No focal deficit present.     Mental Status: He is alert and oriented to person, place, and time.     Comments: No tremor noted.   Psychiatric:        Attention and Perception: He does not perceive auditory or visual hallucinations.        Mood and Affect: Mood is anxious and depressed.        Speech: Speech is rapid and pressured.        Behavior: Behavior is hyperactive. Behavior is not agitated,  slowed, aggressive, withdrawn or combative. Behavior is cooperative.        Thought Content: Thought content is not paranoid or delusional. Thought content does not include suicidal ideation.     Comments: Affect mood congruent and tearful. Patient denies HI or any homicidal intent or specific homicidal plan, but does continue to endorse fleeting generalized thoughts of harming/killing others.  Patient states that earlier today he became fixated on the thought that he had 08/07/2020 mentioned above regarding harming his son which has continued to cause him distress.    Review of Systems  Constitutional:  Positive for  malaise/fatigue and weight loss. Negative for chills, diaphoresis and fever.  HENT:  Negative for congestion.   Respiratory:  Negative for cough and shortness of breath.   Cardiovascular:  Positive for palpitations. Negative for chest pain.       Positive for chest tightness.   Gastrointestinal:  Positive for nausea and vomiting. Negative for abdominal pain, constipation and diarrhea.  Musculoskeletal:  Negative for joint pain and myalgias.  Neurological:  Positive for dizziness. Negative for seizures and headaches.  Psychiatric/Behavioral:  Positive for depression, substance abuse and suicidal ideas. Negative for hallucinations and memory loss. The patient is nervous/anxious. The patient does not have insomnia.   All other systems reviewed and are negative. Patient states that he has been having frequent panic attacks associated with nausea, vomiting, chest tightness, lightheadedness, dizziness, shortness of breath, and palpitations.  Patient endorses nausea currently but denies any of these additional symptoms mentioned above at this time on exam.  Vitals: Blood pressure (!) 119/98, pulse (!) 114, temperature 98.3 F (36.8 C), temperature source Oral, resp. rate 20, height  (1.778 m), weight 68 kg, SpO2 97 %. Body mass index is 21.52 kg/m.  Treatment Plan Summary: Admit patient to the [facility] behavioral [facility] for further crisis stabilization and treatment. Daily contact with patient to assess and evaluate symptoms and progress in treatment and Medication management and therapy   Current medications ordered:  -Tylenol 650 mg PO Q6H PRN for mild pain -Will continue to not have Vistaril ordered at this time due to patient stating he does not want to take Vistaril due to the medication not helping with his anxiety and making him feel drowsy and itchy (see HPI for details).   -Maalox/Mylanta 30 mL p.o. every 4 hours as needed for indigestion  -Milk of Magnesia 30 mL p.o. daily as  needed for mild constipation  -Zofran ODT 4 mg p.o. every 8 hours as needed for nausea/vomiting  -Seroquel 25 mg p.o. daily at bedtime for GAD/depression/sleep/mood stability  -Trazodone 50 mg p.o. at bedtime as needed for sleep  Patient educated on side effect profiles of Seroquel and trazodone, as well as necessary monitoring parameters for Seroquel/antipsychotic medications including EKG, TSH, lipid panel, and hemoglobin A1c.  Patient verbalizes understanding of this education.  Patient reports that his sister and mother currently take Prozac and that they have responded well to the Prozac.  Patient states that he is not sure what his mother and sister took Prozac for.  Patient is inquiring about if it would be a good idea to start him on Prozac due to patient's mothers and sisters reported success with Prozac.  Explained to the patient that I was hesitant to start Prozac right away with the Seroquel at this time before patient has a trial of Seroquel due to potential side effect of Prozac increasing SI and recommended to start with initiating the Seroquel 25  mg p.o. daily at bedtime first without the Prozac and for the patient to continue this discussion about potentially starting Prozac during patient's hospitalization with the patient treatment team. Patient agreed with this plan. Did explain to the patient that Prozac could potentially be initiated during this hospitalization if necessary based on patient's response to current medication trials and patient verbalized understanding and agreement of this.   Observation Level/Precautions:  15 minute checks  Laboratory:   Labs/Tests Reviewed: -PCR Flu A & B, COVID: Negative -CMP: Total bilirubin slightly elevated at 1.7 mg/dL.  CMP otherwise unremarkable -Hemoglobin A1c: Within normal limits -Lipid panel: HDL slightly reduced at 40 mg/dL: Essentially normal, LDL cholesterol slightly elevated at 121 mg/dL.  Lipid panel otherwise unremarkable -TSH:  Within normal limits -Acetaminophen level: Within normal limits -Ethanol level: Within normal limits -Salicylate level: Within normal limits -UDS: Positive for THC -CBC with differential: Added onto patient's previous blood draw: Results pending -08/13/20 EKG: Normal sinus rhythm with sinus arrhythmia, no signs of MI/ischemia, no acute/concerning findings, ventricular rate 74 bpm, PR interval 142 ms, QRS 96 ms, QT/QTC 348/386 ms.  QT/QTC appropriate to initiate antipsychotic medications.  Psychotherapy: Group Therapy  Medications:  See treatment plan summary above/MAR  Consultations:  TBD (none at this time)  Discharge Concerns: Patient's ability to contract for safety, SI, HI, intrusive/ruminating thoughts about suicide and homicide/harming himself/harming others, patient being discharged with psychotropic medication regimen at helps improve patient's symptoms, patient's access to firearms and knives, patient having these intrusive thoughts about harming others with a child at home  Estimated LOS: 3-5 days (patient's estimated length of stay subjective change depending on patient's progress in treatment during his inpatient stay)  Other:     Physician Treatment Plan for Primary Diagnosis: GAD (generalized anxiety disorder) Long Term Goal(s): Improvement in symptoms so as ready for discharge  Short Term Goals: Ability to identify changes in lifestyle to reduce recurrence of condition will improve, Ability to verbalize feelings will improve, Ability to disclose and discuss suicidal ideas, Ability to demonstrate self-control will improve, Ability to identify and develop effective coping behaviors will improve, Compliance with prescribed medications will improve, and Ability to identify triggers associated with substance abuse/mental health issues will improve  Physician Treatment Plan for Secondary Diagnosis: Principal Problem:   GAD (generalized anxiety disorder) Active Problems:   Substance  induced mood disorder (HCC)   Panic attacks   Cannabis use disorder, severe, dependence (HCC)  Long Term Goal(s): Improvement in symptoms so as ready for discharge  Short Term Goals: Ability to identify changes in lifestyle to reduce recurrence of condition will improve, Ability to verbalize feelings will improve, Ability to disclose and discuss suicidal ideas, Ability to demonstrate self-control will improve, Ability to identify and develop effective coping behaviors will improve, Compliance with prescribed medications will improve, and Ability to identify triggers associated with substance abuse/mental health issues will improve  I certify that inpatient services furnished can reasonably be expected to improve the patient's condition.    Please see psychiatrist's suicide risk assessment (SRA) for further details regarding patient's treatment plan.  Jaclyn Shaggy, PA-C 8/5/20222:01 AM

## 2020-08-13 NOTE — Tx Team (Signed)
Initial Treatment Plan 08/13/2020 6:01 PM Todd Moon PZW:[id number]    PATIENT STRESSORS: Marital or family conflict Medication change or noncompliance   PATIENT STRENGTHS: Ability for insight Capable of independent living Metallurgist fund of knowledge   PATIENT IDENTIFIED PROBLEMS: Depression  Suicidal ideations  Homicidal ideation  Medication non compliance               DISCHARGE CRITERIA:  Ability to meet basic life and health needs Adequate post-discharge living arrangements Motivation to continue treatment in a less acute level of care Verbal commitment to aftercare and medication compliance  PRELIMINARY DISCHARGE PLAN: Outpatient therapy Participate in family therapy Return to previous living arrangement Return to previous work or school arrangements  PATIENT/FAMILY INVOLVEMENT: This treatment plan has been presented to and reviewed with the patient, Todd Moon. The patient and family have been given the opportunity to ask questions and make suggestions.  Olin Pia, RN 08/13/2020, 6:01 PM

## 2020-08-13 NOTE — Progress Notes (Signed)
Pt accepted to [facility] 307-1    Patient meets inpatient criteria per Melbourne Abts, PA-C  Dr. Bartholomew Crews is the attending provider.    Call report to [phone]    Seymour Bars, RN @ [facility] ED notified.     Pt scheduled  to arrive at [facility] today by 1500   Damita Dunnings, MSW, LCSW-A  1:21 PM 08/13/2020

## 2020-08-14 DIAGNOSIS — F1220 Cannabis dependence, uncomplicated: Secondary | ICD-10-CM

## 2020-08-14 DIAGNOSIS — F419 Anxiety disorder, unspecified: Secondary | ICD-10-CM

## 2020-08-14 LAB — CBC WITH DIFFERENTIAL/PLATELET
Abs Immature Granulocytes: 0.02 10*3/uL (ref 0.00–0.07)
Basophils Absolute: 0 10*3/uL (ref 0.0–0.1)
Basophils Relative: 1 %
Eosinophils Absolute: 0.1 10*3/uL (ref 0.0–0.5)
Eosinophils Relative: 1 %
HCT: 46.6 % (ref 39.0–52.0)
Hemoglobin: 16.2 g/dL (ref 13.0–17.0)
Immature Granulocytes: 0 %
Lymphocytes Relative: 23 %
Lymphs Abs: 2 10*3/uL (ref 0.7–4.0)
MCH: 31 pg (ref 26.0–34.0)
MCHC: 34.8 g/dL (ref 30.0–36.0)
MCV: 89.1 fL (ref 80.0–100.0)
Monocytes Absolute: 0.7 10*3/uL (ref 0.1–1.0)
Monocytes Relative: 8 %
Neutro Abs: 6 10*3/uL (ref 1.7–7.7)
Neutrophils Relative %: 67 %
Platelets: 268 10*3/uL (ref 150–400)
RBC: 5.23 MIL/uL (ref 4.22–5.81)
RDW: 11.4 % — ABNORMAL LOW (ref 11.5–15.5)
WBC: 8.8 10*3/uL (ref 4.0–10.5)
nRBC: 0 % (ref 0.0–0.2)

## 2020-08-14 MED ORDER — FLUOXETINE HCL 10 MG PO CAPS
10.0000 mg | ORAL_CAPSULE | Freq: Every day | ORAL | Status: DC
  Administered 2020-08-14: 10 mg via ORAL
  Filled 2020-08-14 (×3): qty 1

## 2020-08-14 MED ORDER — NICOTINE 14 MG/24HR TD PT24: 14.0000 mg | MEDICATED_PATCH | Freq: Every day | TRANSDERMAL | Status: DC | PRN

## 2020-08-14 NOTE — Tx Team (Signed)
Interdisciplinary Treatment and Diagnostic Plan Update  08/14/2020 Time of Session: 2:15p Todd Moon MRN: [id number]  Principal Diagnosis: GAD (generalized anxiety disorder)  Secondary Diagnoses: Principal Problem:   GAD (generalized anxiety disorder) Active Problems:   Substance induced mood disorder (HCC)   Panic attacks   Cannabis use disorder, severe, dependence (HCC)   Current Medications:  Current Facility-Administered Medications  Medication Dose Route Frequency Provider Last Rate Last Admin   acetaminophen (TYLENOL) tablet 650 mg  650 mg Oral Q6H PRN Armando Reichert, MD       alum & mag hydroxide-simeth (MAALOX/MYLANTA) 200-200-20 MG/5ML suspension 30 mL  30 mL Oral Q4H PRN Doda, Vandana, MD       magnesium hydroxide (MILK OF MAGNESIA) suspension 30 mL  30 mL Oral Daily PRN Armando Reichert, MD       ondansetron (ZOFRAN-ODT) disintegrating tablet 4 mg  4 mg Oral Q8H PRN Armando Reichert, MD       QUEtiapine (SEROQUEL) tablet 25 mg  25 mg Oral QHS Armando Reichert, MD   25 mg at 08/13/20 2148   traZODone (DESYREL) tablet 50 mg  50 mg Oral QHS PRN Armando Reichert, MD       PTA Medications: Medications Prior to Admission  Medication Sig Dispense Refill Last Dose   hydrOXYzine (ATARAX/VISTARIL) 25 MG tablet Take 1 tablet (25 mg total) by mouth 3 (three) times daily as needed. (Patient taking differently: Take 25 mg by mouth 3 (three) times daily as needed for anxiety.) 30 tablet 0     Patient Stressors: Marital or family conflict Medication change or noncompliance  Patient Strengths: Ability for insight Capable of independent living Occupational psychologist fund of knowledge  Treatment Modalities: Medication Management, Group therapy, Case management,  1 to 1 session with clinician, Psychoeducation, Recreational therapy.   Physician Treatment Plan for Primary Diagnosis: GAD (generalized anxiety disorder) Long Term Goal(s): Improvement in symptoms so as ready  for discharge   Short Term Goals: Ability to identify changes in lifestyle to reduce recurrence of condition will improve Ability to verbalize feelings will improve Ability to disclose and discuss suicidal ideas Ability to demonstrate self-control will improve Ability to identify and develop effective coping behaviors will improve Compliance with prescribed medications will improve Ability to identify triggers associated with substance abuse/mental health issues will improve  Medication Management: Evaluate patient's response, side effects, and tolerance of medication regimen.  Therapeutic Interventions: 1 to 1 sessions, Unit Group sessions and Medication administration.  Evaluation of Outcomes: Not Met  Physician Treatment Plan for Secondary Diagnosis: Principal Problem:   GAD (generalized anxiety disorder) Active Problems:   Substance induced mood disorder (HCC)   Panic attacks   Cannabis use disorder, severe, dependence ([facility])  Long Term Goal(s): Improvement in symptoms so as ready for discharge   Short Term Goals: Ability to identify changes in lifestyle to reduce recurrence of condition will improve Ability to verbalize feelings will improve Ability to disclose and discuss suicidal ideas Ability to demonstrate self-control will improve Ability to identify and develop effective coping behaviors will improve Compliance with prescribed medications will improve Ability to identify triggers associated with substance abuse/mental health issues will improve     Medication Management: Evaluate patient's response, side effects, and tolerance of medication regimen.  Therapeutic Interventions: 1 to 1 sessions, Unit Group sessions and Medication administration.  Evaluation of Outcomes: Not Met   RN Treatment Plan for Primary Diagnosis: GAD (generalized anxiety disorder) Long Term Goal(s): Knowledge of disease and therapeutic regimen  to maintain health will improve  Short Term  Goals: Ability to remain free from injury will improve, Ability to verbalize frustration and anger appropriately will improve, and Ability to demonstrate self-control  Medication Management: RN will administer medications as ordered by provider, will assess and evaluate patient's response and provide education to patient for prescribed medication. RN will report any adverse and/or side effects to prescribing provider.  Therapeutic Interventions: 1 on 1 counseling sessions, Psychoeducation, Medication administration, Evaluate responses to treatment, Monitor vital signs and CBGs as ordered, Perform/monitor CIWA, COWS, AIMS and Fall Risk screenings as ordered, Perform wound care treatments as ordered.  Evaluation of Outcomes: Not Met   LCSW Treatment Plan for Primary Diagnosis: GAD (generalized anxiety disorder) Long Term Goal(s): Safe transition to appropriate next level of care at discharge, Engage patient in therapeutic group addressing interpersonal concerns.  Short Term Goals: Engage patient in aftercare planning with referrals and resources, Increase social support, and Increase ability to appropriately verbalize feelings  Therapeutic Interventions: Assess for all discharge needs, 1 to 1 time with Social worker, Explore available resources and support systems, Assess for adequacy in community support network, Educate family and significant other(s) on suicide prevention, Complete Psychosocial Assessment, Interpersonal group therapy.  Evaluation of Outcomes: Not Met   Progress in Treatment: Attending groups: No. Participating in groups: No. Taking medication as prescribed: Yes. Toleration medication: Yes. Family/Significant other contact made: No, will contact:  CSW will obtain consent Patient understands diagnosis: Yes. Discussing patient identified problems/goals with staff: Yes. Medical problems stabilized or resolved: Yes. Denies suicidal/homicidal ideation: Yes. Issues/concerns  per patient self-inventory: Yes. Other: None  New problem(s) identified: No, Describe:  None  New Short Term/Long Term Goal(s):medication stabilization, elimination of SI thoughts, development of comprehensive mental wellness plan.   Patient Goals:  "get better"  Discharge Plan or Barriers: Patient recently admitted. CSW will continue to follow and assess for appropriate referrals and possible discharge planning.   Reason for Continuation of Hospitalization: Anxiety Homicidal ideation Medication stabilization  Estimated Length of Stay: 3-5 days  Attendees: Patient: Todd Moon 08/14/2020   Physician: Dr. Kai Levins 08/14/2020   Nursing:  08/14/2020   RN Care Manager: 08/14/2020   Social Worker: Toney Reil, LCSWA 08/14/2020   Recreational Therapist:  08/14/2020   Other:  08/14/2020   Other:  08/14/2020   Other: 08/14/2020     Scribe for Treatment Team: Mliss Fritz, [place] 08/14/2020 1:11 PM

## 2020-08-14 NOTE — Progress Notes (Signed)
@  1740 Phone note: Pt. Wanted  nurse to call girlfriend, Shawna Orleans to talk about medicines. Nurse called girlfriend @ [phone]. Pt. Just started  10 mg of Prozac. Pt. Has been out in open areas, and went down to the cafeteria for dinner.

## 2020-08-14 NOTE — [facility] Group Notes (Signed)
Pt attended and participated in AA meeting this evening.  

## 2020-08-14 NOTE — Progress Notes (Signed)
   08/14/20 0636  Vital Signs  Temp 97.9 F (36.6 C)  Temp Source Oral  Pulse Rate (!) 127  Pulse Rate Source Monitor  Resp 18  BP 117/81  BP Location Left Arm  BP Method Automatic  Patient Position (if appropriate) Standing  Oxygen Therapy  SpO2 100 %   D: Patient denies SI/HI/AVH.  Patient rated anxiety 7/10 and depression 6/10

## 2020-08-14 NOTE — [facility] Counselor (Signed)
[place] LCSW Note  08/14/2020   10:21 AM  Type of Contact and Topic:  PSA Attempt  CSW met with pt in efforts to complete PSA. Pt reported being tired and having not gotten much sleep. Pt requested to complete PSA later today.  CSW will continue efforts to complete PSA w/ pt.     Blane Ohara, LCSW 08/14/2020  10:21 AM

## 2020-08-14 NOTE — BHH Counselor (Signed)
Adult Comprehensive Assessment  Patient ID: Todd Moon, male   DOB: 07-15-1995, 25 y.o.   MRN: [id number]  Information Source: Information source: Patient  Current Stressors:  Patient states their primary concerns and needs for treatment are:: "The homicidal thoughts. They're making me sick and there's no intent or plan but just the thoughts of killing other people. 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 Social relationships: "They're good" Substance abuse: "I smoke marijuana, pretty much daily since I was about 15. That's how this all started, racing thoughts and anxiety" Bereavement / Loss: "None"  Living/Environment/Situation:  Living Arrangements: Spouse/significant other, Children Living conditions (as described by patient or guardian): "They're good, we live along a long driveway, my dad's there and my grandfather's at the end of the driveway" Who else lives in the home?: Girlfriend and 2 yo son. How long has patient lived in current situation?: "Just moved in on our land 3 weeks ago" What is atmosphere in current home: Comfortable, Loving, Supportive  Family History:  Marital status: Long term relationship Long term relationship, how long?: 4 years. Pt currently engaged. What types of issues is patient dealing with in the relationship?: "No, she's good to me" Are you sexually  active?: Yes What is your sexual orientation?: Heterosexual Does patient have children?: Yes How many children?: 1 How is patient's relationship with their children?: 2 yo son. "It's great, I love my little boy"  Childhood History:  By whom was/is the patient raised?: Grandparents, Father, Mother Additional childhood history information: "My mother wasn't the best, rehabed on pills. Stayed mostly with my grandparents" Description of patient's relationship with caregiver when they were a child: "I don't remember relationship with mother, with dad and grandparents it's great" Patient's description of current relationship with people who raised him/her: "Mom's cleaned up a bit and it's good, still great with dad and grandparents" How were you disciplined when you got in trouble as a child/adolescent?: "I really wasn't too much, I got a spanking if I did anything wrong but only maybe once or twice" Does patient have siblings?: Yes Number of Siblings: 1 (27 yo sister.) Description of patient's current relationship with siblings: "Good, I see her almost every day" Did patient suffer any verbal/emotional/physical/sexual abuse as a child?: No Did patient suffer from severe childhood neglect?: No Has patient ever been sexually abused/assaulted/raped as an adolescent or adult?: No Was the patient ever a victim of a crime or a disaster?: No Witnessed domestic violence?: No Has patient been affected by domestic violence as an adult?: No  Education:  Highest grade of school patient has completed: 10th grade Currently a student?: No Learning disability?: No ("I know I have them but that's the reason I dropped out.")  Employment/Work Situation:   Employment Situation: Employed Where is Patient Currently Employed?: [employer] How Long has Patient Been Employed?: 2 years Are You Satisfied With Your Job?: No Do You Work More Than  One Job?: No Work Stressors: "I got into it with my  supervisor cause he told me he was going to write me up for someone elses doing. I wan't to earn more money but can't get a job" Patient's Job has Been Impacted by Current Illness: No What is the Longest Time Patient has Held a Job?: 4-5 years Where was the Patient Employed at that Time?: [employer] Has Patient ever Been in the [employer]?: No  Financial Resources:   Financial resources: Income from employment, Income from spouse, Private insurance Does patient have a representative payee or guardian?: No  Alcohol/Substance Abuse:   What has been your use of drugs/alcohol within the last 12 months?: "Daily marijuana use. Mushrooms 1x 3-4 months ago, 2-3 beers within the last year" If attempted suicide, did drugs/alcohol play a role in this?: No Alcohol/Substance Abuse Treatment Hx: Denies past history Has alcohol/substance abuse ever caused legal problems?: Yes ("I've been caught with marijuana. 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 Good friend, great father" Patient states they can use these personal strengths during their treatment to contribute to their recovery: "Always think about my little boy and make sure I get help" Patient states these barriers may affect their return to the community: "If I'm not fixed I don't want to sit and worry, so I'll just come right back"  Discharge Plan:   Currently receiving community mental health services: No Patient states concerns and preferences for aftercare planning are: Open  to referrals for medication management and therapy referrals. Patient states they will know when they are safe and ready for discharge when: "Just calm down, without worrying about them thoughts" Does patient have access to transportation?: Yes Does patient have financial barriers related to discharge medications?: No Will patient be returning to same living situation after discharge?: Yes  Summary/Recommendations:   Summary and Recommendations (to be completed by the evaluator): Todd Moon is a 25 y.o. male admitted voluntarily to [facility] after presenting to [facility], accompanied by girlfriend, due to increased anxiety surrounding HI towards others. Pt reports experiencing HI towards others, with no plan or intent and racing thoughts of harming others leading him to throw up. Pt reports stressors to include wanting to earn more money, racing thoughts, and substance use. Pt reports hx of SI with no plan or intent, HI with no plan or intent, denying AVH. Pt reports daily marijuana use, one instance of mushroom use, and consumption of 2-3 beers within the last 12 months. Pt does not currently receive any other community supports and has requested referrals to community providers for continued medication management and therapy services. Patient will benefit from crisis stabilization, medication evaluation, group therapy and psychoeducation, in addition to case management for discharge planning. At discharge it is recommended that Patient adhere to the established discharge plan and continue in treatment.  Leisa Lenz. 08/14/2020

## 2020-08-14 NOTE — Progress Notes (Signed)
[facility] MD Progress Note  08/15/2020 6:55 AM Todd Moon  MRN:  [id number]  Chief Complaint: anxiety  Subjective:  Todd Moon is a 25 y.o. male with a history of GAD and cannabis use d/o, who was initially admitted for inpatient psychiatric hospitalization on 08/13/2020 for management of worsening anxiety and intrusive, unwanted thoughts or hurting others. The patient is currently on Hospital Day 2.   Chart Review from last 24 hours:  The patient's chart was reviewed and nursing notes were reviewed. The patient's case was discussed in multidisciplinary team meeting. Per nursing he c/o anxiety and obsessive violent thoughts but had no behavioral issues or acute safety concerns. He attended groups. Per [facility] he was compliant with scheduled medications and did not require PRNS.   Information Obtained Today During Patient Interview: The patient was seen and evaluated on the unit. On assessment today the patient reports that he slept well overnight and feels less anxious today. He denies side-effect with start of Prozac. He voices no physical complaints and denies SI. He states he has intermittent, intrusive thoughts of HI toward unknown persons but no intent or plan to harm others. He is bothered by the thoughts but denies having rituals to make the thoughts go away. He denies AVH, paranoia, ideas of reference, or first rank symptoms. He reports stable appetite. We discussed titration up on his Seroquel to help with ruminations and anxiety and start of Neurontin to help with anxiety. We will plan to titrate up on Prozac to 20mg  tomorrow if he tolerates the current dose today.    Principal Problem: Anxiety disorder, unspecified Diagnosis: Principal Problem:   Anxiety disorder, unspecified Active Problems:   Substance induced mood disorder (HCC)   Panic attacks   Cannabis use disorder, severe, dependence (HCC)  Total Time Spent in Direct Patient Care:  I personally spent 25 minutes on the unit in direct  patient care. The direct patient care time included face-to-face time with the patient, reviewing the patient's chart, communicating with other professionals, and coordinating care. Greater than 50% of this time was spent in counseling or coordinating care with the patient regarding goals of hospitalization, psycho-education, and discharge planning needs.  Past Psychiatric History: see H&P  Past Medical History:  Past Medical History:  Diagnosis Date   Kidney stones     Past Surgical History:  Procedure Laterality Date   TONSILLECTOMY     TYMPANOSTOMY TUBE PLACEMENT     Family History:  Family History  Problem Relation Age of Onset   Healthy Mother    Healthy Father    Family Psychiatric  History: see H&P  Social History:  Social History   Substance and Sexual Activity  Alcohol Use Yes   Comment: social     Social History   Substance and Sexual Activity  Drug Use Never    Social History   Socioeconomic History   Marital status: Single    Spouse name: Not on file   Number of children: Not on file   Years of education: Not on file   Highest education level: Not on file  Occupational History   Not on file  Tobacco Use   Smoking status: Never   Smokeless tobacco: Former    Types: Chew    Quit date: 09/01/2018  Vaping Use   Vaping Use: Never used  Substance and Sexual Activity   Alcohol use: Yes    Comment: social   Drug use: Never   Sexual activity: Not on file  Other  Topics Concern   Not on file  Social History Narrative   Not on file   Social Determinants of Health   Financial Resource Strain: Not on file  Food Insecurity: Not on file  Transportation Needs: Not on file  Physical Activity: Not on file  Stress: Not on file  Social Connections: Not on file   Sleep: Poor  Appetite:  Good  Current Medications: Current Facility-Administered Medications  Medication Dose Route Frequency Provider Last Rate Last Admin   acetaminophen (TYLENOL) tablet  650 mg  650 mg Oral Q6H PRN Karsten Rooda, Vandana, MD       alum & mag hydroxide-simeth (MAALOX/MYLANTA) 200-200-20 MG/5ML suspension 30 mL  30 mL Oral Q4H PRN Karsten Rooda, Vandana, MD       FLUoxetine (PROZAC) capsule 10 mg  10 mg Oral Daily Mason JimSingleton, Corrinne Benegas E, MD   10 mg at 08/14/20 1735   magnesium hydroxide (MILK OF MAGNESIA) suspension 30 mL  30 mL Oral Daily PRN Karsten Rooda, Vandana, MD       nicotine (NICODERM CQ - dosed in mg/24 hours) patch 14 mg  14 mg Transdermal Daily PRN Mason JimSingleton, Dalton Molesworth E, MD       ondansetron (ZOFRAN-ODT) disintegrating tablet 4 mg  4 mg Oral Q8H PRN Karsten Rooda, Vandana, MD       QUEtiapine (SEROQUEL) tablet 25 mg  25 mg Oral QHS Karsten Rooda, Vandana, MD   25 mg at 08/14/20 2142   traZODone (DESYREL) tablet 50 mg  50 mg Oral QHS PRN Karsten Rooda, Vandana, MD        Lab Results:  Results for orders placed or performed during the hospital encounter of 08/13/20 (from the past 48 hour(s))  CBC with Differential     Status: Abnormal   Collection Time: 08/13/20 12:14 PM  Result Value Ref Range   WBC 8.8 4.0 - 10.5 K/uL   RBC 5.23 4.22 - 5.81 MIL/uL   Hemoglobin 16.2 13.0 - 17.0 g/dL   HCT 40.946.6 81.139.0 - 91.452.0 %   MCV 89.1 80.0 - 100.0 fL   MCH 31.0 26.0 - 34.0 pg   MCHC 34.8 30.0 - 36.0 g/dL   RDW 78.211.4 (L) 95.611.5 - 21.315.5 %   Platelets 268 150 - 400 K/uL   nRBC 0.0 0.0 - 0.2 %   Neutrophils Relative % 67 %   Neutro Abs 6.0 1.7 - 7.7 K/uL   Lymphocytes Relative 23 %   Lymphs Abs 2.0 0.7 - 4.0 K/uL   Monocytes Relative 8 %   Monocytes Absolute 0.7 0.1 - 1.0 K/uL   Eosinophils Relative 1 %   Eosinophils Absolute 0.1 0.0 - 0.5 K/uL   Basophils Relative 1 %   Basophils Absolute 0.0 0.0 - 0.1 K/uL   Immature Granulocytes 0 %   Abs Immature Granulocytes 0.02 0.00 - 0.07 K/uL    Comment: Performed at [facility], [address] [place], [place] [address]    Blood Alcohol level:  Lab Results  Component Value Date   ETH <10 08/13/2020    Metabolic Disorder Labs: Lab Results  Component Value Date    HGBA1C 5.3 08/13/2020   MPG 105.41 08/13/2020   No results found for: PROLACTIN Lab Results  Component Value Date   CHOL 168 08/13/2020   TRIG 35 08/13/2020   HDL 40 (L) 08/13/2020   CHOLHDL 4.2 08/13/2020   VLDL 7 08/13/2020   LDLCALC 121 (H) 08/13/2020    Musculoskeletal: Strength & Muscle Tone: within normal limits Gait & Station:  normal, steady Patient leans: N/A  Psychiatric Specialty Exam: Physical Exam Vitals reviewed.  HENT:     Head: Normocephalic.  Pulmonary:     Effort: Pulmonary effort is normal.  Neurological:     General: No focal deficit present.     Mental Status: He is alert.    Review of Systems  Respiratory:  Negative for shortness of breath.   Cardiovascular:  Negative for chest pain.  Gastrointestinal:  Negative for diarrhea, nausea and vomiting.   Blood pressure (!) 125/113, pulse (!) 127, temperature 98.1 F (36.7 C), temperature source Oral, resp. rate 20, height 5\' 10"  (1.778 m), weight 68 kg, SpO2 98 %.Body mass index is 21.52 kg/m.  General Appearance: Disheveled  Eye Contact:  Good  Speech:  Clear and Coherent and Normal Rate  Volume:  Normal  Mood:  Anxious  Affect:  Congruent and Constricted  Thought Process:  Goal Directed and Linear  Orientation:  Full (Time, Place, and Person)  Thought Content:   Intrusive, unwanted thoughts of hurting others; no compulsions; denies AVH, paranoia, ideas of reference, or first rank symptoms; no delusions noted  Suicidal Thoughts:  No  Homicidal Thoughts:   Unwanted thoughts of harm toward  unknown others  Memory:  Recent;   Good  Judgement:  Fair  Insight:  Fair  Psychomotor Activity:  Normal  Concentration:  Concentration: Good and Attention Span: Good  Recall:  Good  Fund of Knowledge:  Good  Language:  Good  Akathisia:  Negative  Assets:  Communication Skills Desire for Improvement Housing Physical Health Resilience Social Support  ADL's:  independent  Cognition:  WNL  Sleep:  Number  of Hours: 6.75   Treatment Plan Summary: Diagnoses / Active Problems: Unspecified anxiety d/o with panic attacks (r/o GAD by hx, r/o panic d/o, r/o OCD, r/o substance induced mood d/o) Cannabis use d/o  PLAN: Safety and Monitoring:  -- Voluntary admission to inpatient psychiatric unit for safety, stabilization and treatment  -- Daily contact with patient to assess and evaluate symptoms and progress in treatment  -- Patient's case to be discussed in multi-disciplinary team meeting  -- Observation Level : q15 minute checks  -- Vital signs:  q12 hours  -- Precautions: suicide, elopement, and assault  2. Psychiatric Diagnoses and Treatment:   Unspecified anxiety d/o with panic attacks (r/o GAD by hx, r/o panic d/o, r/o OCD, r/o substance induced mood d/o)  -- Continue Prozac 10mg  daily for anxiety and ruminations - increase to 20mg  daily starting tomorrow  -- Increase Seroquel to 50mg  qhs for anxiety and sleep as well as ruminations  -- Start Neurontin 100mg  tid for anxiety  -- Encouraged patient to participate in unit milieu and in scheduled group therapies   -- Short Term Goals: Ability to demonstrate self-control will improve and Ability to identify and develop effective coping behaviors will improve  -- Long Term Goals: Improvement in symptoms so as ready for discharge   Cannabis Use d/o  -- Counseled on the need to abstain from illicit substance use  -- Short Term Goals: Ability to identify triggers associated with substance abuse/mental health issues will improve  -- Long Term Goals: Improvement in symptoms so as ready for discharge   3. Medical Issues Being Addressed:   Tobacco Use Disorder  -- Nicotine patch 14mg /24 hours ordered PRN  -- Nicotine cessation encouraged  4. Discharge Planning:   -- Social work and case management to assist with discharge planning and identification of hospital follow-up needs prior  to discharge  -- Estimated LOS: 3-4 days  -- Discharge  Concerns: Need to establish a safety plan; Medication compliance and effectiveness  -- Discharge Goals: Return home with outpatient referrals for mental health follow-up including medication management/psychotherapy   Comer Locket, MD, FAPA 08/15/2020, 6:55 AM

## 2020-08-14 NOTE — BHH Group Notes (Signed)
Patient did not attend morning orientation group.  

## 2020-08-14 NOTE — Progress Notes (Signed)
Adult Psychoeducational Group Note  Date:  08/14/2020 Time:  7:04 PM  Group Topic/Focus:  Healthy Communication:   The focus of this group is to discuss communication, barriers to communication, as well as healthy ways to communicate with others.  Participation Level:  Active  Participation Quality:  Appropriate  Affect:  Appropriate  Cognitive:  Alert  Insight: Good  Engagement in Group:  Engaged  Modes of Intervention:  Discussion  Additional Comments:  Pt attended group and participated in discussion.  Charles Andringa R Marquinn Meschke 08/14/2020, 7:04 PM

## 2020-08-14 NOTE — [facility] Group Notes (Signed)
Due to the acuity, staffing and complex discharge plans, group was not held. Patient was provided therapeutic worksheets and asked to meet with CSW as needed.   Artha Stavros, LCSWA Clinicial Social Worker [facility]  

## 2020-08-14 NOTE — [facility] Suicide Risk Assessment (Addendum)
[facility] Admission Suicide Risk Assessment   Nursing information obtained from:  Patient Demographic factors:  Caucasian, male, young adult Current Mental Status:  severe anxiety Loss Factors:  NA Historical Factors:  Impulsivity, Family history of mental illness or substance abuse Risk Reduction Factors:  Responsible for children under 26 years of age  Total Time Spent in Direct Patient Care:  I personally spent 40 minutes on the unit in direct patient care. The direct patient care time included face-to-face time with the patient, reviewing the patient's chart, communicating with other professionals, and coordinating care. Greater than 50% of this time was spent in counseling or coordinating care with the patient regarding goals of hospitalization, psycho-education, and discharge planning needs.  Principal Problem: GAD (generalized anxiety disorder) Diagnosis:  Principal Problem:   GAD (generalized anxiety disorder) Active Problems:   Substance induced mood disorder (HCC)   Panic attacks   Cannabis use disorder, severe, dependence (HCC)  Subjective Data: Patient is a 25y/o male with past psychiatric history of GAD and cannabis use d/o who initially presented to [facility] on 08/08/20 for help with anxiety and was discharged home on Vistaril with plans for outpatient management with PHP referral. On 08/13/20 he represented to [facility] with worsening anxiety and did not meet criteria for [facility] since he is a resident of [facility] and was therefore transferred to [facility] ED for stabilization. While in the ED he was initially started on Lexapro and Vistaril but he felt the Vistaril was making him drowsy and the Lexapro was discontinued. He was started on Seroquel 25mg  qhs and then transferred voluntarily to [facility] when a bed became available for management of persistent anxiety.   According to the patient, last Friday while babysitting his son, he had an intrusive, unwanted thought of wanting to "kill someone"  after seeing a knife on the kitchen table. The thought triggered him to have a panic attack and he started ruminating with worry as to the reason why he had an unwanted thought of harming others. He states he has never had HI and had no intention of harming his son or others but was disturbed by the thought and decided to seek help. He states that he tried the Vistaril given to him at [facility] but his anxiety did not improve and he continued ruminating with worry as to why he would have thoughts of hurting others. He states while in [employer] he saw firearms which triggered him to ruminate about the recent mass shootings and school shooting which then triggered additional panic attacks. He decided to return for additional help since he could not stop the persistent ruminations. He denies h/o OCD and specifically denies worry over contamination, worry over mistakes/need for checking, or need for symmetry. He denies h/o rituals when he has a worry thought including retracing steps, counting, or touching/arranging items. He denies recent depressive symptoms. He denies current SI or HI and denies previous psychiatric admissions or suicide attempts. He denies h/o violence of aggression. He denies AVH, paranoia, ideas of reference, or first rank symptoms. He describes having physical signs of anxiety but is vague as to somatic issues related to anxiety/worry.  He states he has been "nervous" and "a worrier" his entire life but never had panic attacks until recently. He denies concern that he will have additional panic attacks and denies agoraphobia. He denies h/o mania/hypomania. He admits to use of THC daily since age 25-16 and remote h/o cocaine use 5-6 years ago and mushroom use 4 months ago.  He states he drinks alcohol about once a year. See H&P for additional information.   Continued Clinical Symptoms:  Alcohol Use Disorder Identification Test Final Score (AUDIT): 0 The "Alcohol Use Disorders Identification Test",  Guidelines for Use in Primary Care, Second Edition.  World [employer] writer [place]). Score between 0-7:  no or low risk or alcohol related problems. Score between 8-15:  moderate risk of alcohol related problems. Score between 16-19:  high risk of alcohol related problems. Score 20 or above:  warrants further diagnostic evaluation for alcohol dependence and treatment.  CLINICAL FACTORS:   Severe Anxiety and/or Agitation Panic Attacks Previous Psychiatric Diagnoses and Treatments Substance use prior to admission  Musculoskeletal: Strength & Muscle Tone: within normal limits Gait & Station: normal, steady Patient leans: N/A Psychiatric Specialty Exam: Physical Exam Vitals reviewed.  HENT:     Head: Normocephalic.  Pulmonary:     Effort: Pulmonary effort is normal.  Neurological:     General: No focal deficit present.     Mental Status: He is alert.    Review of Systems - see H&P  Blood pressure 117/81, pulse (!) 127, temperature 97.9 F (36.6 C), temperature source Oral, resp. rate 18, height 5\' 10"  (1.778 m), weight 68 kg, SpO2 100 %.Body mass index is 21.52 kg/m.  General Appearance: Disheveled  Eye Contact:  Fair  Speech:  Clear and Coherent and Normal Rate  Volume:  Normal  Mood:  Anxious, dysphoric  Affect:  Congruent  Thought Process:  Goal Directed and Linear  Orientation:  Full (Time, Place, and Person)  Thought Content:   Reports intrusive unwanted thoughts or harming others but denies AVH, ideas of reference, delusions, paranoia, or first rank symptoms; no evidence of acute psychosis on exam  Suicidal Thoughts:  No  Homicidal Thoughts:   Has unwanted thoughts of harm toward unknown others without intent or plan  Memory:  Recent;   Good  Judgement:  Fair  Insight:  Fair  Psychomotor Activity:  Restlessness  Concentration:  Concentration: Fair and Attention Span: Fair  Recall:  Good  Fund of Knowledge:  Good  Language:  Good  Akathisia:  Negative  Assets:   Communication Skills Desire for Improvement Housing Resilience Social Support  ADL's:  independent  Cognition:  WNL  Sleep:  Number of Hours: 6   COGNITIVE FEATURES THAT CONTRIBUTE TO RISK:  Thought constriction (tunnel vision)    SUICIDE RISK:   Mild: There are no identifiable plans, no associated intent, mild dysphoria and related symptoms, good self-control (both objective and subjective assessment), few other risk factors, and identifiable protective factors, including available and accessible social support.  PLAN OF CARE: Patient admitted voluntarily to [facility]. Admission labs reviewed: UDS positive for THC, Salicylate <7, ETOH <10,Tylenol <10, TSH 0.960, Lipid WNL except for HDL 40 and LDL 121, A1c 5.3, CMP WNL except for total bili 1.7, WBC 8.8, H/H 16.2/46.6 and platelets 268. EKG shows NSR with sinus arrythmia 74bpm with QTC 02-06-2001.  Discussed treatment options with patient and he is willing to try Prozac stating this medication has worked well for other family members. The r/b/se/a to Prozac including risk of SI based on FDA black box warning in his age range were discussed and he consents to med trial. He wishes to continue Seroquel to help with anxiety and sleep and the risks of developing TD/EPS, DM, elevated lipids, and weight gain were discussed. I advised that he does not meet full criteria for panic disorder, GAD, or OCD but  does appear to have panic attacks with disabling anxiety at this time. I advised that his use of substances could also have contributed to panic symptoms and that a substance induced mood d/o cannot be excluded. He was counseled on the need to abstain from alcohol and substance use.   I certify that inpatient services furnished can reasonably be expected to improve the patient's condition.   Comer Locket, MD, FAPA 08/14/2020, 8:44 AM

## 2020-08-15 MED ORDER — FLUOXETINE HCL 20 MG PO CAPS
20.0000 mg | ORAL_CAPSULE | Freq: Every day | ORAL | Status: DC
  Administered 2020-08-16 – 2020-08-18 (×3): 20 mg via ORAL
  Filled 2020-08-15 (×6): qty 1

## 2020-08-15 MED ORDER — QUETIAPINE FUMARATE 50 MG PO TABS
50.0000 mg | ORAL_TABLET | Freq: Every day | ORAL | Status: DC
  Administered 2020-08-15: 50 mg via ORAL
  Filled 2020-08-15 (×3): qty 1

## 2020-08-15 MED ORDER — GABAPENTIN 100 MG PO CAPS
100.0000 mg | ORAL_CAPSULE | Freq: Three times a day (TID) | ORAL | Status: DC
  Administered 2020-08-15 – 2020-08-18 (×11): 100 mg via ORAL
  Filled 2020-08-15 (×19): qty 1

## 2020-08-15 MED ORDER — FLUOXETINE HCL 10 MG PO CAPS
10.0000 mg | ORAL_CAPSULE | Freq: Every day | ORAL | Status: AC
  Administered 2020-08-15: 10 mg via ORAL
  Filled 2020-08-15: qty 1

## 2020-08-15 NOTE — Progress Notes (Signed)
Pt said he slept well. Still feeling somewhat tired. Seems less anxious this morning.

## 2020-08-15 NOTE — Progress Notes (Signed)
The focus of this group is to help patients review their daily goal of treatment and discuss progress on daily workbooks. Pt attended the evening group session and responded to all discussion prompts from the Writer. Pt shared that today was a good day on the unit, the highlight of which was a visit from his fiancee, who's visited him every day. "She's been great to me through this."  Pt told  that his goal for the coming week was to discharge, which he felt well enough to do. Pt was encouraged to share this with his treatment team, but to also be prepared to share his plan to stay well upon discharge. Pt mentioned having coping skills and a strong support network to help him along.  Pt rated his day a 7 out of 10 and his affect was appropriate.

## 2020-08-15 NOTE — Progress Notes (Signed)
Adult Psychoeducational Group Note  Date:  08/15/2020 Time:  11:01 AM  Group Topic/Focus:  Goals Group:   The focus of this group is to help patients establish daily goals to achieve during treatment and discuss how the patient can incorporate goal setting into their daily lives to aide in recovery.  Participation Level:  Active  Participation Quality:  Appropriate  Affect:  Appropriate  Cognitive:  Appropriate  Insight: Appropriate  Engagement in Group:  Engaged  Modes of Intervention:  Discussion  Additional Comments:  Pt attended group and listened to discussions.  Shaniqwa Horsman R Latrisa Hellums 08/15/2020, 11:01 AM

## 2020-08-15 NOTE — Progress Notes (Addendum)
   08/15/20 1600  Psych Admission Type (Psych Patients Only)  Admission Status Voluntary  Psychosocial Assessment  Patient Complaints Anxiety;Depression  Eye Contact Fair  Facial Expression Anxious  Affect Anxious;Fearful  Speech Logical/coherent  Interaction Assertive  Motor Activity Other (Comment) (wnl)  Appearance/Hygiene Unremarkable  Behavior Characteristics Cooperative;Anxious  Mood Anxious  Thought Process  Coherency WDL  Content WDL (racing violent thoughts; anxious)  Delusions None reported or observed  Perception WDL  Hallucination None reported or observed  Judgment Poor  Confusion None  Danger to Self  Current suicidal ideation? Denies  Self-Injurious Behavior No self-injurious ideation or behavior indicators observed or expressed   Agreement Not to Harm Self Yes  Description of Agreement Verbal contract  Danger to Others  Danger to Others None reported or observed     D. Pt presents with an anxious affect/ mood- rated his depression, hopelessness, and anxiety all 6's on pt's self inventory. Pt endorsed some passive SI- no plan or intent- agreed to contact staff before acting on any thoughts of self harm. Pt denies A/VH and does not appear to be responding to internal stimuli. Pt expressed concern over his intrusive thoughts, asking, "Am I going to get better and have others with the same problem gotten better?". A Labs and vitals monitored. Pt given and educated on medications. Pt supported emotionally and encouraged to express concerns and ask questions.   R. Pt remains safe with 15 minute checks. Will continue POC.

## 2020-08-15 NOTE — Progress Notes (Signed)
   08/15/20 2142  Psych Admission Type (Psych Patients Only)  Admission Status Voluntary  Psychosocial Assessment  Patient Complaints None  Eye Contact Fair  Facial Expression Flat  Affect Appropriate to circumstance  Speech Logical/coherent  Interaction Assertive  Motor Activity Other (Comment) (WDL)  Appearance/Hygiene Unremarkable  Behavior Characteristics Appropriate to situation  Mood Pleasant  Thought Process  Coherency WDL  Content WDL  Delusions None reported or observed  Perception WDL  Hallucination None reported or observed  Judgment Poor  Confusion None  Danger to Self  Current suicidal ideation? Denies  Self-Injurious Behavior No self-injurious ideation or behavior indicators observed or expressed   Agreement Not to Harm Self Yes  Description of Agreement Verbal contract  Danger to Others  Danger to Others None reported or observed

## 2020-08-15 NOTE — Progress Notes (Signed)
   08/14/20 2100  Psych Admission Type (Psych Patients Only)  Admission Status Voluntary  Psychosocial Assessment  Patient Complaints Anxiety  Eye Contact Fair  Facial Expression Anxious  Affect Anxious;Fearful  Speech Logical/coherent  Interaction Assertive  Motor Activity Other (Comment) (wnl)  Appearance/Hygiene Unremarkable  Behavior Characteristics Cooperative;Anxious  Mood Anxious;Fearful  Thought Process  Coherency WDL  Content WDL (racing violent thoughts; anxious)  Delusions None reported or observed  Perception WDL  Hallucination None reported or observed  Judgment Poor  Confusion None  Danger to Self  Current suicidal ideation? Denies  Danger to Others  Danger to Others None reported or observed   Pt approached nurse's station after AA group this evening. Says he is very anxious (8/10) and having obsessive violent thoughts. "I have thoughts of hurting other people but I won't act on it. That's why I'm here. Because my thoughts scare me. I just need something for my anxiety. What they have me earlier seemed to help but it's not helping anymore." The only medication  patient given earlier was Prozac 10 mg. Pt denies SI, AVH and pain.  Pt given his nighttime dose of Seroquel. Told he could have Trazodone as well if the Seroquel didn't work to calm him down.  Pt said that both his mother and his sister take Prozac. He talked with them this evening. His mother's dosage is Prozac 20 mg TID. His sister takes Prozac 20 mg QD but dose is being titrated upwards.

## 2020-08-15 NOTE — [facility] Group Notes (Signed)
LCSW Group Therapy Note  Date/Time:  08/15/2020   10:00AM-11:00AM  Type of Therapy and Topic:  Group Therapy:  Fears and Unhealthy/Healthy Coping Skills  Participation Level:  Active   Description of Group:  The focus of this group was to discuss some of the prevalent fears that patients experience, and to identify the commonalities among group members.  A fun exercise was used to initiate the discussion, followed by writing on the white board a group-generated list of unhealthy coping and healthy coping techniques to deal with each fear.    Therapeutic Goals: Patient will be able to distinguish between healthy and unhealthy coping skills Patient will identify and describe 3 fears they experience Patient will identify one positive coping strategy for each fear they experience Patient will respond empathetically to peers' statements regarding fears they experience  Summary of Patient Progress:  The patient expressed himself freely and insightfully throughout group.  He was attentive at all times and listened, made contributions to the group topic, was appropriate.  Therapeutic Modalities Cognitive Behavioral Therapy Motivational Interviewing  Ambrose Mantle, LCSW

## 2020-08-16 MED ORDER — LOPERAMIDE HCL 2 MG PO CAPS: 2.0000 mg | ORAL_CAPSULE | Freq: Once | ORAL | Status: DC | PRN

## 2020-08-16 MED ORDER — QUETIAPINE FUMARATE 25 MG PO TABS
75.0000 mg | ORAL_TABLET | Freq: Every day | ORAL | Status: DC
  Administered 2020-08-16 – 2020-08-17 (×2): 75 mg via ORAL
  Filled 2020-08-16 (×4): qty 3

## 2020-08-16 NOTE — Progress Notes (Signed)
[facility] MD Progress Note  08/16/2020 7:25 AM Todd Moon  MRN:  [id number]  Chief Complaint: anxiety  Subjective:  Todd Moon is a 25 y.o. male with a history of GAD and cannabis use d/o, who was initially admitted for inpatient psychiatric hospitalization on 08/13/2020 for management of worsening anxiety and intrusive, unwanted thoughts or hurting others. The patient is currently on Hospital Day 3.   Chart Review from last 24 hours:  The patient's chart was reviewed and nursing notes were reviewed. The patient's case was discussed in multidisciplinary team meeting. Per nursing he had passive SI yesterday but contracted for safety and attended groups. He had no acute behavioral issues.  Per [facility] he was compliant with scheduled medications and did not require PRNS.   Information Obtained Today During Patient Interview: The patient was seen and evaluated on the unit. He states he is feeling less anxious today and has been able to converse and socialize with peers which he sees a major sign of improvement. He has residual ruminations and worry that he could hurt others which he states are more prominent at night. He denies homicidal intent or plan but states the unwanted thoughts are still present but improving. He is able to reassure himself that the thoughts are irrational,and he denies ritualized behaviors in response to unwanted thoughts. He denies AVH, paranoia, or SI. He admits he used THC concentrated wax as well as smoked THC prior to admission and it is possible this contributed to his recent anxiety and ruminations. He was again counseled on the need to abstain from substance use after discharge. He reports stable sleep and appetite and voices no physical complaints other than mild diarrhea. He thinks his medications are helping and we discussed dose titration up on Seroquel for nighttime ruminations.   Principal Problem: Anxiety disorder, unspecified Diagnosis: Principal Problem:   Anxiety  disorder, unspecified Active Problems:   Substance induced mood disorder (HCC)   Panic attacks   Cannabis use disorder, severe, dependence (HCC)  Total Time Spent in Direct Patient Care:  I personally spent 25 minutes on the unit in direct patient care. The direct patient care time included face-to-face time with the patient, reviewing the patient's chart, communicating with other professionals, and coordinating care. Greater than 50% of this time was spent in counseling or coordinating care with the patient regarding goals of hospitalization, psycho-education, and discharge planning needs.  Past Psychiatric History: see H&P  Past Medical History:  Past Medical History:  Diagnosis Date   Kidney stones     Past Surgical History:  Procedure Laterality Date   TONSILLECTOMY     TYMPANOSTOMY TUBE PLACEMENT     Family History:  Family History  Problem Relation Age of Onset   Healthy Mother    Healthy Father    Family Psychiatric  History: see H&P  Social History:  Social History   Substance and Sexual Activity  Alcohol Use Yes   Comment: social     Social History   Substance and Sexual Activity  Drug Use Never    Social History   Socioeconomic History   Marital status: Single    Spouse name: Not on file   Number of children: Not on file   Years of education: Not on file   Highest education level: Not on file  Occupational History   Not on file  Tobacco Use   Smoking status: Never   Smokeless tobacco: Former    Types: Chew    Quit date: 09/01/2018  Vaping Use   Vaping Use: Never used  Substance and Sexual Activity   Alcohol use: Yes    Comment: social   Drug use: Never   Sexual activity: Not on file  Other Topics Concern   Not on file  Social History Narrative   Not on file   Social Determinants of Health   Financial Resource Strain: Not on file  Food Insecurity: Not on file  Transportation Needs: Not on file  Physical Activity: Not on file  Stress:  Not on file  Social Connections: Not on file   Sleep: Good  Appetite:  Good  Current Medications: Current Facility-Administered Medications  Medication Dose Route Frequency Provider Last Rate Last Admin   acetaminophen (TYLENOL) tablet 650 mg  650 mg Oral Q6H PRN Leone Haven, Vandana, MD       alum & mag hydroxide-simeth (MAALOX/MYLANTA) 200-200-20 MG/5ML suspension 30 mL  30 mL Oral Q4H PRN Leone Haven, Vandana, MD       FLUoxetine (PROZAC) capsule 20 mg  20 mg Oral Daily Mason Jim, Raunel Dimartino E, MD       gabapentin (NEURONTIN) capsule 100 mg  100 mg Oral TID Comer Locket, MD   100 mg at 08/15/20 1827   magnesium hydroxide (MILK OF MAGNESIA) suspension 30 mL  30 mL Oral Daily PRN Karsten Ro, MD       nicotine (NICODERM CQ - dosed in mg/24 hours) patch 14 mg  14 mg Transdermal Daily PRN Mason Jim, Adjoa Althouse E, MD       ondansetron (ZOFRAN-ODT) disintegrating tablet 4 mg  4 mg Oral Q8H PRN Doda, Vandana, MD       QUEtiapine (SEROQUEL) tablet 50 mg  50 mg Oral QHS Mason Jim, Preston Garabedian E, MD   50 mg at 08/15/20 2126   traZODone (DESYREL) tablet 50 mg  50 mg Oral QHS PRN Karsten Ro, MD        Lab Results:  No results found for this or any previous visit (from the past 48 hour(s)).   Blood Alcohol level:  Lab Results  Component Value Date   ETH <10 08/13/2020    Metabolic Disorder Labs: Lab Results  Component Value Date   HGBA1C 5.3 08/13/2020   MPG 105.41 08/13/2020   No results found for: PROLACTIN Lab Results  Component Value Date   CHOL 168 08/13/2020   TRIG 35 08/13/2020   HDL 40 (L) 08/13/2020   CHOLHDL 4.2 08/13/2020   VLDL 7 08/13/2020   LDLCALC 121 (H) 08/13/2020    Musculoskeletal: Strength & Muscle Tone: within normal limits Gait & Station: normal, steady Patient leans: N/A  Psychiatric Specialty Exam: Physical Exam Vitals reviewed.  HENT:     Head: Normocephalic.  Pulmonary:     Effort: Pulmonary effort is normal.  Neurological:     General: No focal deficit present.      Mental Status: He is alert.    Review of Systems  Respiratory:  Negative for shortness of breath.   Cardiovascular:  Negative for chest pain.  Gastrointestinal:  Positive for diarrhea. Negative for nausea and vomiting.   Blood pressure 121/73, pulse (!) 118, temperature 97.9 F (36.6 C), temperature source Oral, resp. rate 16, height 5\' 10"  (1.778 m), weight 68 kg, SpO2 98 %.Body mass index is 21.52 kg/m.  General Appearance: Casually dressed, improved hygiene  Eye Contact:  Good  Speech:  Clear and Coherent and Normal Rate  Volume:  Normal  Mood:  Anxious  Affect:  Congruent  Thought Process:  Goal  Directed and Linear  Orientation:  Full (Time, Place, and Person)  Thought Content:  Intrusive, unwanted thoughts of hurting others; no compulsions; denies AVH, paranoia, ideas of reference, or first rank symptoms; no signs of internal preoccupation on exam  Suicidal Thoughts:  No  Homicidal Thoughts:   Unwanted thoughts of harm toward  unknown others  but denies specific HI, intent or plan  Memory:  Recent;   Good  Judgement:  Fair  Insight:  Fair  Psychomotor Activity:  Normal  Concentration:  Concentration: Good and Attention Span: Good  Recall:  Good  Fund of Knowledge:  Good  Language:  Good  Akathisia:  Negative  Assets:  Communication Skills Desire for Improvement Housing Physical Health Resilience Social Support  ADL's:  independent  Cognition:  WNL  Sleep:  Number of Hours: 6.5   Treatment Plan Summary: Diagnoses / Active Problems: Unspecified anxiety d/o with panic attacks (r/o GAD by hx, r/o panic d/o, r/o OCD, r/o substance induced mood d/o) Cannabis use d/o  PLAN: Safety and Monitoring:  -- Voluntary admission to inpatient psychiatric unit for safety, stabilization and treatment  -- Daily contact with patient to assess and evaluate symptoms and progress in treatment  -- Patient's case to be discussed in multi-disciplinary team meeting  -- Observation Level :  q15 minute checks  -- Vital signs:  q12 hours  -- Precautions: suicide, elopement, and assault  2. Psychiatric Diagnoses and Treatment:   Unspecified anxiety d/o with panic attacks (r/o GAD by hx, r/o panic d/o, r/o OCD, r/o substance induced mood d/o)  -- Increased to Prozac 20mg  daily for anxiety and ruminations   -- Increase Seroquel to 75mg  qhs for anxiety and sleep as well as ruminations  -- Continue Neurontin 100mg  tid for anxiety  -- Encouraged patient to participate in unit milieu and in scheduled group therapies   -- Short Term Goals: Ability to demonstrate self-control will improve and Ability to identify and develop effective coping behaviors will improve  -- Long Term Goals: Improvement in symptoms so as ready for discharge   Cannabis Use d/o  -- Counseled on the need to abstain from illicit substance use  -- Short Term Goals: Ability to identify triggers associated with substance abuse/mental health issues will improve  -- Long Term Goals: Improvement in symptoms so as ready for discharge   3. Medical Issues Being Addressed:   Tobacco Use Disorder  -- Nicotine patch 14mg /24 hours ordered PRN  -- Nicotine cessation encouraged   Diarrhea  -- Imodium PRN  4. Discharge Planning:   -- Social work and case management to assist with discharge planning and identification of hospital follow-up needs prior to discharge  -- Estimated LOS: 3-4 days  -- Discharge Concerns: Need to establish a safety plan; Medication compliance and effectiveness  -- Discharge Goals: Return home with outpatient referrals for mental health follow-up including medication management/psychotherapy   , MD, FAPA 08/16/2020, 7:25 AM

## 2020-08-16 NOTE — [facility] Group Notes (Signed)
[facility] LCSW Group Therapy Note  08/16/2020    Type of Therapy and Topic:  Group Therapy:  Songs to [employer] Actions at Discharge  Participation Level:  Minimal   Description of Group:   In this group, two songs were played to re-emphasize group on fear from yesterday:  Dear Insecurity and [employer].  Patients were given the opportunity to share their feelings and thoughts.  Patients were then introduced to the concept that additional supports including self-support are an essential part of recovery.  A song (My Own Hero) was played and a group discussion ensued in which patients stated the song inspired them to realize they have be willing to help themselves in order to succeed, because other people cannot achieve sobriety or stability for them.  Patients were encouraged toward self-advocacy and self-support as part of their recovery, including the establishment of appropriate boundaries in various relationships.  They also identified at least one support they need to add in order to achieve their goals at discharge.   Therapeutic Goals: 1)  explain the difference between healthy and unhealthy supports  2)  demonstrate the importance of being a key part of one's own support system 3)  discuss the need for appropriate boundaries with supports 4)  elicit ideas from patients about supports that need to be added in order to achieve goals   Summary of Patient Progress:   The patient expressed support(s) to add at discharge include himself being a better self-support.  The patient participated by listening and did not make comments but did nod at times.  Therapeutic Modalities:   Motivational Interviewing Activity  Lynnell Chad

## 2020-08-16 NOTE — Progress Notes (Signed)
Pt is A&Ox4, is clam, cooperative and pleasant for most of the shift, but was resistant to care and to get out of bed to take medications in the morning ignoring multiple staff requests for pt to take AM meds. Later pt became more compliant and took them late. Pt attended groups, is quiet soft spoken, minimally interactive with staff. Pt attended groups. Pt has fair insight regarding the triggers that led to admission, reporting he has HI and intrusive thoughts of HI without a reason for it, prior to admission, but denies any SI/HI/AVH now. Pt reports it was "situational I think." Pt denies depression and anxiety at this time. Will continue to monitor pt per Q15 minute face checks and monitor for safety and progress.

## 2020-08-16 NOTE — [facility] Group Notes (Signed)
[facility] Group Notes:  (Nursing/MHT/Case Management/Adjunct)  Date:  08/16/2020  Time:  10:00am  Type of Therapy:   The focus of this group is to help patients establish daily goals to achieve during treatment and discuss how the patient can incorporate goal setting into their daily lives to aide in recovery.    Participation Level:  Active  Participation Quality:  Appropriate and Attentive  Affect:  Appropriate and Flat  Cognitive:  Alert, Appropriate, and Oriented  Insight:  Appropriate and Improving  Engagement in Group:  Engaged and Improving  Modes of Intervention:  Discussion, Education, Rapport Building, and Support  Summary of Progress/Problems: Today's Goal: "Keep, keeping my head up." Pt is calm and cooperative, attentive and respectful to others during group.  Karren Burly 08/16/2020, 10:00am

## 2020-08-16 NOTE — [facility] Group Notes (Signed)
Adult Psychoeducational Group Note  Date:  08/16/2020 Time:  8:50 PM  Group Topic/Focus:  Coping With Mental Health Crisis:   The purpose of this group is to help patients identify strategies for coping with mental health crisis.  Group discusses possible causes of crisis and ways to manage them effectively.  Participation Level:  Active  Participation Quality:  Attentive  Affect:  Appropriate  Cognitive:  Alert  Insight: Good  Engagement in Group:  Engaged  Modes of Intervention:  Discussion  Additional Comments  Todd Moon 08/16/2020, 8:50 PM

## 2020-08-17 IMAGING — CT CT RENAL STONE PROTOCOL
1 of 2 series · 15 of 32 positions shown, 19 images · non-contrast
Comparison: None.

CLINICAL DATA: Right flank pain intermittent dysuria and hematuria

EXAM:
CT ABDOMEN AND PELVIS WITHOUT CONTRAST
TECHNIQUE: Multidetector CT imaging of the abdomen and pelvis was performed
following the standard protocol without oral or IV contrast.

[Series 2: axial st · axial · 0.67mm/px · z∈[-967,-562]mm · 15 of 89 slices shown, 19 images]
[im 4/89  soft-tissue]
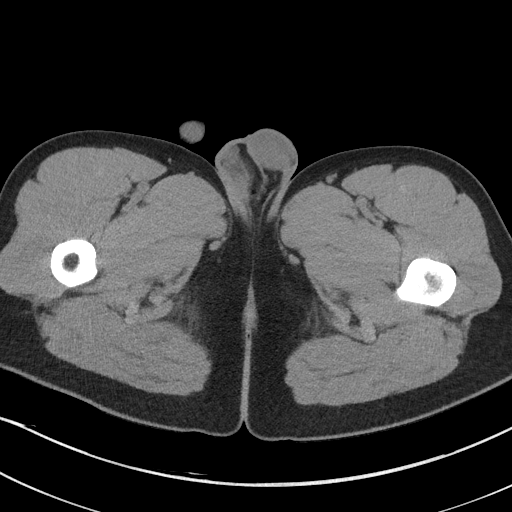
[im 4/89  bone]
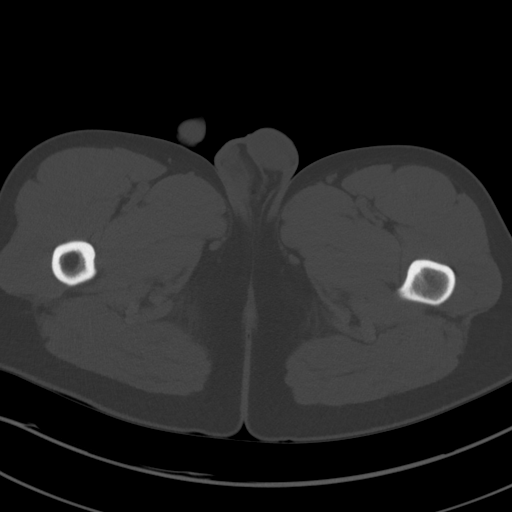
[im 12/89  soft-tissue]
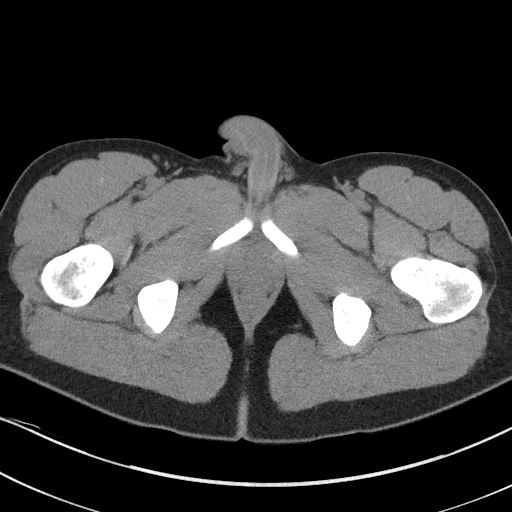
[im 19/89  soft-tissue]
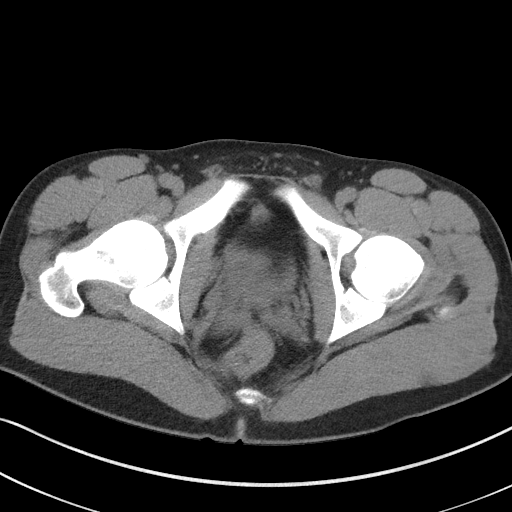
[im 26/89  soft-tissue]
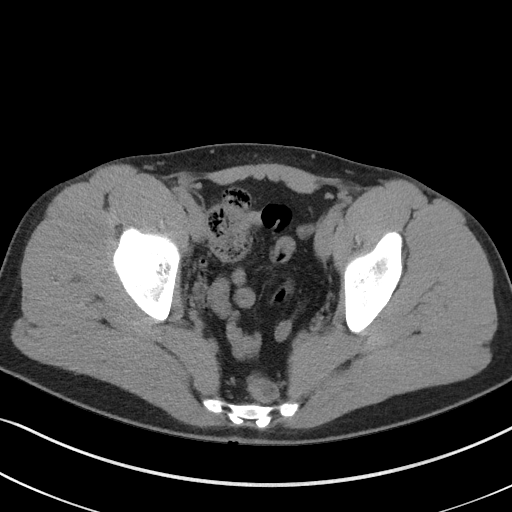
[im 30/89  soft-tissue]
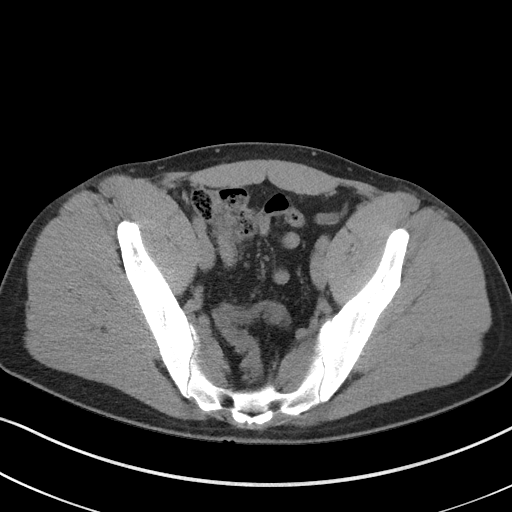
[im 37/89  soft-tissue]
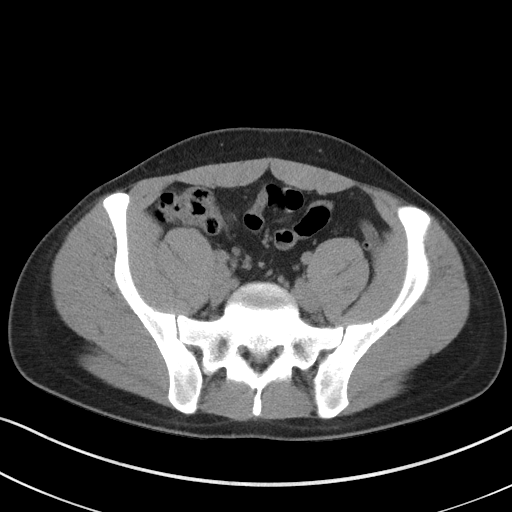
[im 45/89  soft-tissue]
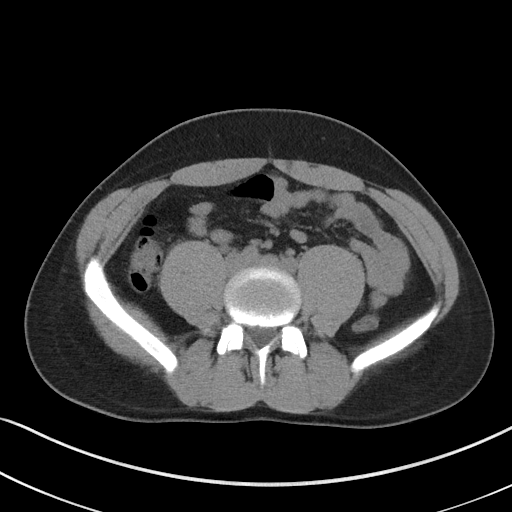
[im 52/89  soft-tissue]
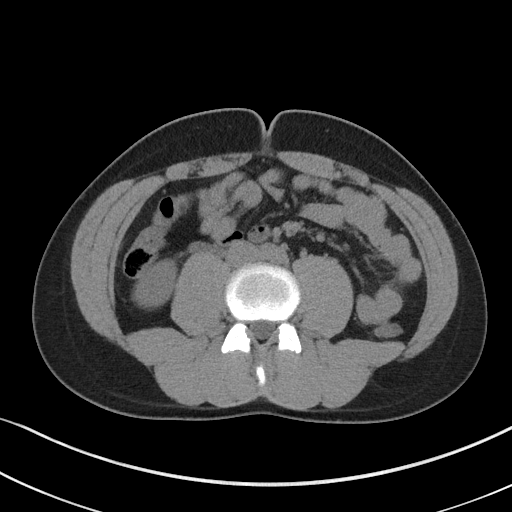
[im 59/89  soft-tissue]
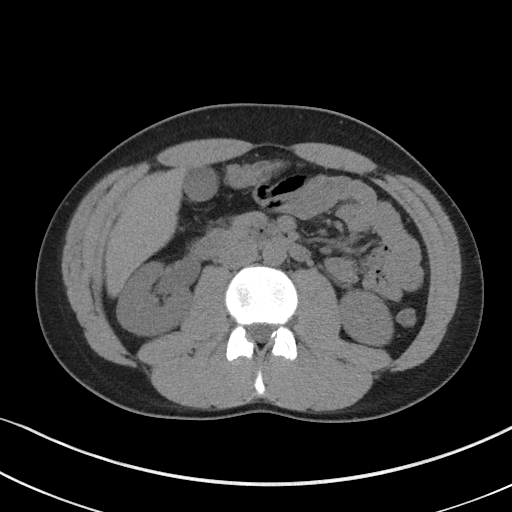
[im 59/89  bone]
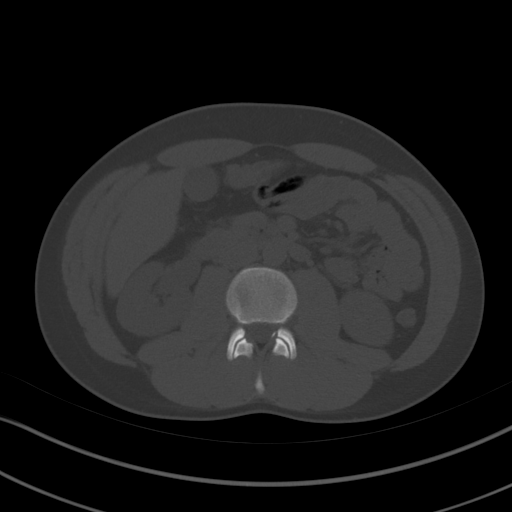
[im 63/89  soft-tissue]
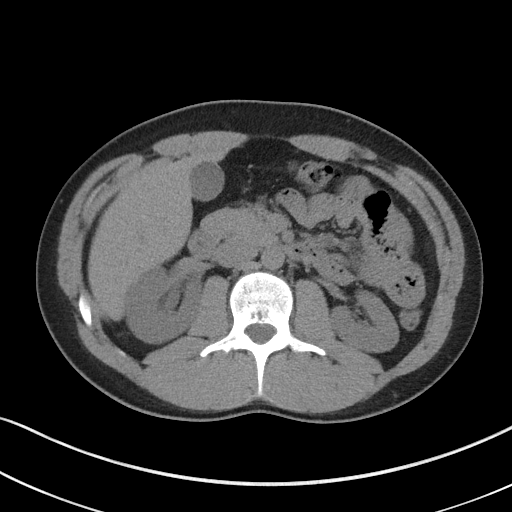
[im 70/89  soft-tissue]
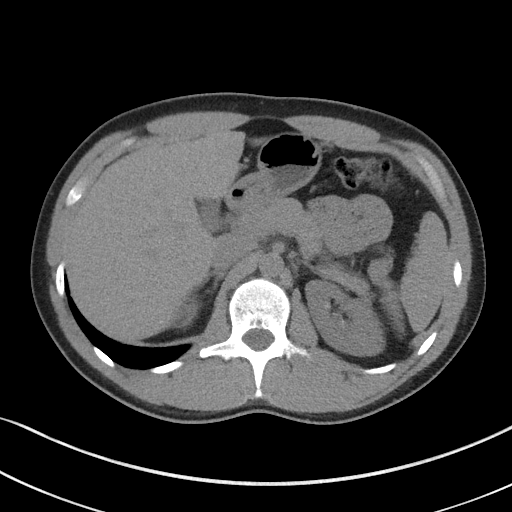
[im 74/89  lung]
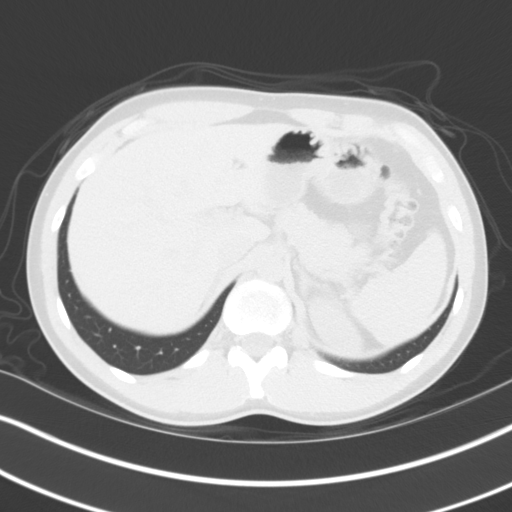
[im 78/89  soft-tissue]
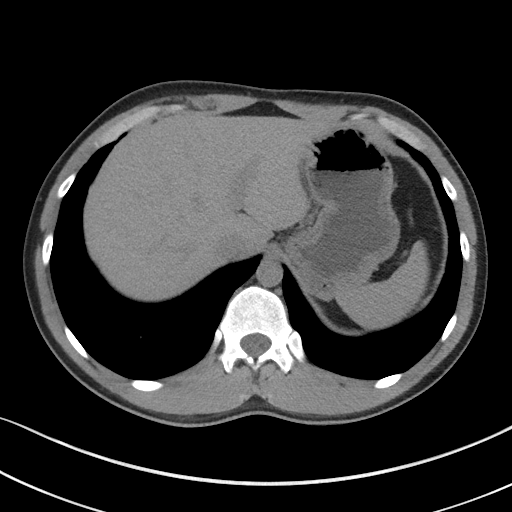
[im 78/89  lung]
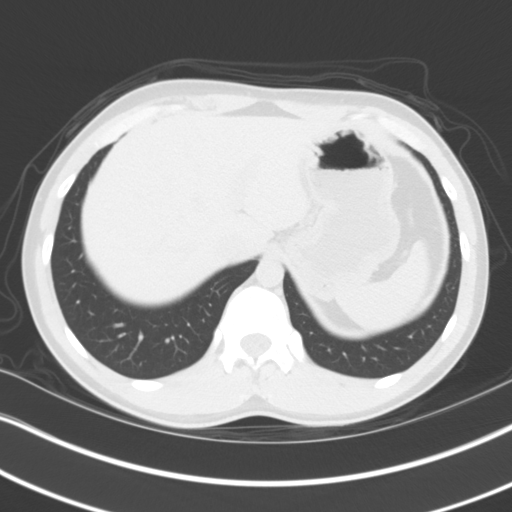
[im 81/89  lung]
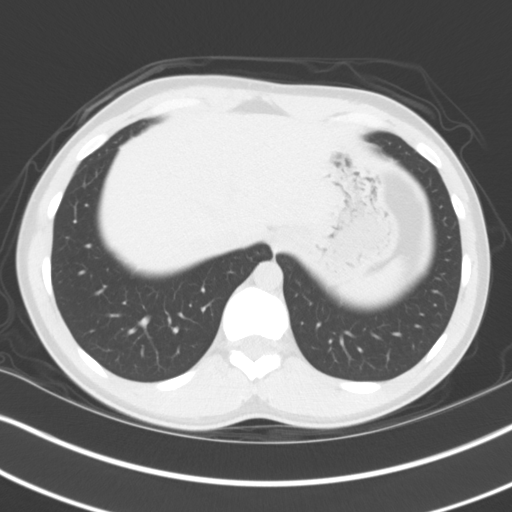
[im 85/89  soft-tissue]
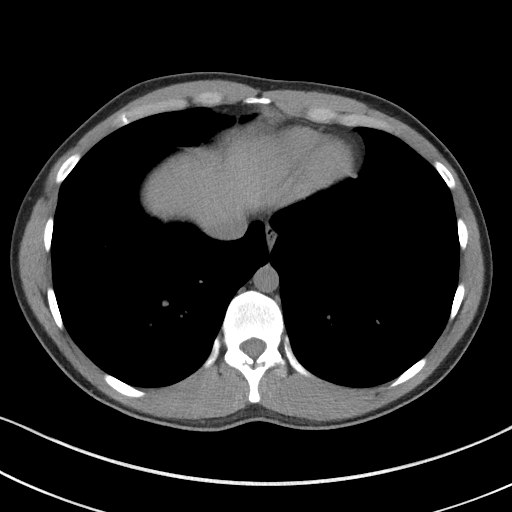
[im 85/89  lung]
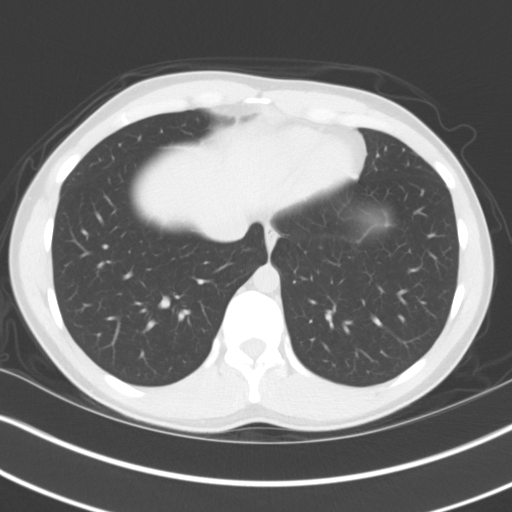

[15 of 32 positions shown; findings below may reference images not displayed]

FINDINGS: Lower chest: Lung bases are clear.

Hepatobiliary: No focal liver lesions are appreciable on this
noncontrast enhanced study. Gallbladder wall thickness is normal.
There is no evident biliary duct dilatation.

Pancreas: There is no pancreatic mass or inflammatory focus.

Spleen: No splenic lesions are evident.

Adrenals/Urinary Tract: Adrenals bilaterally appear normal. There is
mild hydronephrosis on the right. No hydronephrosis evident on the
left. No evident renal mass on either side. There is slight
nephrocalcinosis bilaterally. There are scattered 1 mm calculi
throughout each kidney. There is a 3 x 2 mm calculus in the distal
right ureter near the ureterovesical junction. No other ureteral
calculi are evident. Urinary bladder wall thickness is within normal
limits for nearly empty state.

Stomach/Bowel: There is no appreciable bowel wall or mesenteric
thickening. Terminal ileum appears normal. No evident bowel
obstruction. No free air or portal venous air.

Vascular/Lymphatic: There is no abdominal aortic aneurysm. No
vascular lesions are evident on this noncontrast enhanced study.
There is no evident adenopathy in the abdomen or pelvis.

Reproductive: Prostate and seminal vesicles are normal in size and
contour. No evident pelvic mass.

Other: Appendix appears unremarkable. No evident abscess or ascites
in the abdomen or pelvis.

Musculoskeletal: No blastic or lytic bone lesions. No intramuscular
or abdominal wall lesions are evident.
IMPRESSION: 1. Mild hydronephrosis on the right with a 3 x 2 mm calculus in the
distal right ureter near the ureterovesical junction.

2. Slight nephrocalcinosis with scattered 1 mm calculi throughout
each kidney.

3. No bowel obstruction. No abscess in the abdomen or pelvis.
Appendix appears normal.

## 2020-08-17 NOTE — [facility] Group Notes (Signed)
08/17/2020   Type of Therapy and Topic:  Group Therapy - Healthy vs Unhealthy Coping Skills  Participation Level:  Active   Description of Group The focus of this group was to determine what unhealthy coping techniques typically are used by group members and what healthy coping techniques would be helpful in coping with various problems. Patients were guided in becoming aware of the differences between healthy and unhealthy coping techniques. Patients were asked to identify 2-3 healthy coping skills they would like to learn to use more effectively, and many mentioned meditation, breathing, and relaxation. These were explained, samples demonstrated, and resources shared for how to learn more at discharge. At group closing, additional ideas of healthy coping skills were shared in a fun exercise.  Therapeutic Goals Patients learned that coping is what human beings do all day long to deal with various situations in their lives Patients defined and discussed healthy vs unhealthy coping techniques Patients identified their preferred coping techniques and identified whether these were healthy or unhealthy Patients determined 2-3 healthy coping skills they would like to become more familiar with and use more often, and practiced a few medications Patients provided support and ideas to each other   Summary of Patient Progress:  Pt attended group and participated appropriately   Therapeutic Modalities Cognitive Behavioral Therapy Motivational Interviewing  Terricka Onofrio, LCSWA Clinicial Social Worker [facility]   

## 2020-08-17 NOTE — Progress Notes (Signed)
[facility] MD Progress Note  08/17/2020 7:56 PM Todd Moon  MRN:  [id number]  Chief Complaint: anxiety  Subjective:  Todd Moon is a 25 y.o. male with a history of GAD and cannabis use d/o, who was initially admitted for inpatient psychiatric hospitalization on 08/13/2020 for management of worsening anxiety and intrusive, unwanted thoughts or hurting others. The patient is currently on Hospital Day 4.   Chart Review from last 24 hours:  The patient's chart was reviewed and nursing notes were reviewed. The patient's case was discussed in multidisciplinary team meeting. Per nursing he had passive SI yesterday but contracted for safety and attended groups. He had no acute behavioral issues.  Per [facility] he was compliant with scheduled medications and did not require PRNs.   Information Obtained Today During Patient Interview: The patient was seen and evaluated on the unit. He reports that he has not had any obsessive thoughts over the past 24 hrs. When challenged as to what would happen if he were to see a knife nearby, he responded in a balanced manner saying he would be fearful but that the thoughts would not overwhelm him. He reports wanting to stay away from marijuana going forward. He reports that he likes his Prozac and feels he will "be on it forever". He reports that his home firearms have been locked up and removed.He denies SI/AVH. He reports resolution of his diarrhea. ROS as below.  Principal Problem: Anxiety disorder, unspecified Diagnosis: Principal Problem:   Anxiety disorder, unspecified Active Problems:   Substance induced mood disorder (HCC)   Panic attacks   Cannabis use disorder, severe, dependence (HCC)  Total Time Spent in Direct Patient Care:  I personally spent 25 minutes on the unit in direct patient care. The direct patient care time included face-to-face time with the patient, reviewing the patient's chart, communicating with other professionals, and coordinating care. Greater  than 50% of this time was spent in counseling or coordinating care with the patient regarding goals of hospitalization, psycho-education, and discharge planning needs.  Past Psychiatric History: see H&P  Past Medical History:  Past Medical History:  Diagnosis Date   Kidney stones     Past Surgical History:  Procedure Laterality Date   TONSILLECTOMY     TYMPANOSTOMY TUBE PLACEMENT     Family History:  Family History  Problem Relation Age of Onset   Healthy Mother    Healthy Father    Family Psychiatric  History: see H&P  Social History:  Social History   Substance and Sexual Activity  Alcohol Use Yes   Comment: social     Social History   Substance and Sexual Activity  Drug Use Never    Social History   Socioeconomic History   Marital status: Single    Spouse name: Not on file   Number of children: Not on file   Years of education: Not on file   Highest education level: Not on file  Occupational History   Not on file  Tobacco Use   Smoking status: Never   Smokeless tobacco: Former    Types: Chew    Quit date: 09/01/2018  Vaping Use   Vaping Use: Never used  Substance and Sexual Activity   Alcohol use: Yes    Comment: social   Drug use: Never   Sexual activity: Not on file  Other Topics Concern   Not on file  Social History Narrative   Not on file   Social Determinants of Corporate investment banker  Strain: Not on file  Food Insecurity: Not on file  Transportation Needs: Not on file  Physical Activity: Not on file  Stress: Not on file  Social Connections: Not on file   Sleep: Good  Appetite:  Good  Current Medications: Current Facility-Administered Medications  Medication Dose Route Frequency Provider Last Rate Last Admin   acetaminophen (TYLENOL) tablet 650 mg  650 mg Oral Q6H PRN Leone Haven, Vandana, MD       alum & mag hydroxide-simeth (MAALOX/MYLANTA) 200-200-20 MG/5ML suspension 30 mL  30 mL Oral Q4H PRN Karsten Ro, MD       FLUoxetine  (PROZAC) capsule 20 mg  20 mg Oral Daily Mason Jim, Amy E, MD   20 mg at 08/17/20 1012   gabapentin (NEURONTIN) capsule 100 mg  100 mg Oral TID Comer Locket, MD   100 mg at 08/17/20 1730   loperamide (IMODIUM) capsule 2 mg  2 mg Oral Once PRN Comer Locket, MD       magnesium hydroxide (MILK OF MAGNESIA) suspension 30 mL  30 mL Oral Daily PRN Doda, Vandana, MD       nicotine (NICODERM CQ - dosed in mg/24 hours) patch 14 mg  14 mg Transdermal Daily PRN Mason Jim, Amy E, MD       ondansetron (ZOFRAN-ODT) disintegrating tablet 4 mg  4 mg Oral Q8H PRN Doda, Vandana, MD       QUEtiapine (SEROQUEL) tablet 75 mg  75 mg Oral QHS Mason Jim, Amy E, MD   75 mg at 08/16/20 2128   traZODone (DESYREL) tablet 50 mg  50 mg Oral QHS PRN Karsten Ro, MD        Lab Results:  No results found for this or any previous visit (from the past 48 hour(s)).   Blood Alcohol level:  Lab Results  Component Value Date   ETH <10 08/13/2020    Metabolic Disorder Labs: Lab Results  Component Value Date   HGBA1C 5.3 08/13/2020   MPG 105.41 08/13/2020   No results found for: PROLACTIN Lab Results  Component Value Date   CHOL 168 08/13/2020   TRIG 35 08/13/2020   HDL 40 (L) 08/13/2020   CHOLHDL 4.2 08/13/2020   VLDL 7 08/13/2020   LDLCALC 121 (H) 08/13/2020    Musculoskeletal: Strength & Muscle Tone: within normal limits Gait & Station: normal, steady Patient leans: N/A  Psychiatric Specialty Exam: Physical Exam Vitals reviewed.  HENT:     Head: Normocephalic.  Pulmonary:     Effort: Pulmonary effort is normal.  Neurological:     General: No focal deficit present.     Mental Status: He is alert.    Review of Systems  Respiratory:  Negative for shortness of breath.   Cardiovascular:  Negative for chest pain.  Gastrointestinal:  Negative for diarrhea, nausea and vomiting.   Blood pressure 119/79, pulse (!) 126, temperature 97.7 F (36.5 C), temperature source Oral, resp. rate 16, height  5\' 10"  (1.778 m), weight 68 kg, SpO2 100 %.Body mass index is 21.52 kg/m.  General Appearance: Casually dressed, improved hygiene  Eye Contact:  Good  Speech:  Clear and Coherent and Normal Rate  Volume:  Normal  Mood:  Anxious  Affect:  Congruent  Thought Process:  Goal Directed and Linear  Orientation:  Full (Time, Place, and Person)  Thought Content:  logical, denies obsessive thoughts; denies AVH, paranoia, ideas of reference, or first rank symptoms; no signs of internal preoccupation on exam  Suicidal Thoughts:  No  Homicidal Thoughts:  denies specific HI, intent or plan  Memory:  Recent;   Good  Judgement:  Fair  Insight:  Fair  Psychomotor Activity:  Normal  Concentration:  Concentration: Good and Attention Span: Good  Recall:  Good  Fund of Knowledge:  Good  Language:  Good  Akathisia:  Negative  Assets:  Communication Skills Desire for Improvement Housing Physical Health Resilience Social Support  ADL's:  independent  Cognition:  WNL  Sleep:  Number of Hours: 6.75   Treatment Plan Summary: Diagnoses / Active Problems: Unspecified anxiety d/o with panic attacks (r/o GAD by hx, r/o panic d/o, r/o OCD, r/o substance induced mood d/o) Cannabis use d/o  PLAN: Safety and Monitoring:  -- Voluntary admission to inpatient psychiatric unit for safety, stabilization and treatment  -- Daily contact with patient to assess and evaluate symptoms and progress in treatment  -- Patient's case to be discussed in multi-disciplinary team meeting  -- Observation Level : q15 minute checks  -- Vital signs:  q12 hours  -- Precautions: suicide, elopement, and assault  2. Psychiatric Diagnoses and Treatment:   Unspecified anxiety d/o with panic attacks (r/o GAD by hx, r/o panic d/o, r/o OCD, r/o substance induced mood d/o)  -- Continue Prozac 20mg  daily for anxiety and ruminations   -- Continue Seroquel 75mg  qhs for anxiety and sleep as well as ruminations  -- Continue Neurontin 100mg   tid for anxiety  -- Encouraged patient to participate in unit milieu and in scheduled group therapies   -- Short Term Goals: Ability to demonstrate self-control will improve and Ability to identify and develop effective coping behaviors will improve  -- Long Term Goals: Improvement in symptoms so as ready for discharge   Cannabis Use d/o  -- Counseled on the need to abstain from illicit substance use  -- Short Term Goals: Ability to identify triggers associated with substance abuse/mental health issues will improve  -- Long Term Goals: Improvement in symptoms so as ready for discharge   3. Medical Issues Being Addressed:   Tobacco Use Disorder  -- Nicotine patch 14mg /24 hours ordered PRN  -- Nicotine cessation encouraged   Diarrhea (now resolved)  -- Imodium PRN  4. Discharge Planning:   -- Social work and case management to assist with discharge planning and identification of hospital follow-up needs prior to discharge  -- Estimated LOS: 3-4 days  -- Discharge Concerns: Need to establish a safety plan; Medication compliance and effectiveness  -- Discharge Goals: Return home with outpatient referrals for mental health follow-up including medication management/psychotherapy   PGY-1, Psychiatry

## 2020-08-17 NOTE — Progress Notes (Signed)
Adult Psychoeducational Group Note  Date:  08/17/2020 Time:  11:17 AM  Group Topic/Focus:  Goals Group:   The focus of this group is to help patients establish daily goals to achieve during treatment and discuss how the patient can incorporate goal setting into their daily lives to aide in recovery.  Participation Level:  Did Not Attend  Deforest Hoyles [facility] 08/17/2020, 11:17 AM

## 2020-08-17 NOTE — [facility] Group Notes (Signed)
[facility] Group Notes:  (Nursing/MHT/Case Management/Adjunct)  Date:  08/17/2020  Time:  11:10 PM  Type of Therapy:  Group Therapy  Participation Level:  Active  Participation Quality:  Attentive  Affect:  Appropriate  Cognitive:  Appropriate  Insight:  Improving  Engagement in Group:  Developing/Improving  Modes of Intervention:  Discussion  Summary of Progress/Problems:  Lorita Officer 08/17/2020, 11:10 PM

## 2020-08-17 NOTE — Progress Notes (Signed)
Todd Moon was up and visible on the unit.  He attended evening wrap up group.  He interacts well with staff and peers.  He stated he had a good day and was glad that he came to [facility].  "I didn't realize that I needed the help."  He is looking forward to discharging soon.  He denied SI/HI or AVH.  He took his bedtime medications without difficulty.  He is currently resting with his eyes closed and appears to be asleep.  Q 15 minute checks maintained for safety.   08/16/20 2140  Psych Admission Type (Psych Patients Only)  Admission Status Voluntary  Psychosocial Assessment  Patient Complaints None  Eye Contact Fair  Facial Expression Flat  Affect Appropriate to circumstance  Speech Logical/coherent  Interaction Assertive  Motor Activity Other (Comment) (unremarkable)  Appearance/Hygiene Unremarkable  Behavior Characteristics Anxious;Cooperative  Mood Pleasant;Anxious  Thought Process  Coherency WDL  Content WDL  Delusions None reported or observed  Perception WDL  Hallucination None reported or observed  Judgment Poor  Confusion None  Danger to Self  Current suicidal ideation? Denies  Danger to Others  Danger to Others None reported or observed

## 2020-08-17 NOTE — [facility] Group Notes (Signed)
Occupational Therapy Group Note Date: 08/17/2020 Group Topic/Focus: Coping Skills  Group Description: Group Description: Group encouraged increased engagement and participation through discussion focused on coping through music. Group members were encouraged to create a "coping skills playlist" that consisted of 20 songs with different cited categories/topics including "a song that reminds you of a good memory" and "a song that makes you want to dance", etc. Discussion followed with patients sharing their playlists and choosing one for the group to listen and respond too.   Therapeutic Goal(s): Identify positive songs to improve coping through music Identify and share benefit of music as a coping strategy  Participation Level: Active   Participation Quality: Independent   Behavior: Calm, Cooperative, and Interactive   Speech/Thought Process: Focused   Affect/Mood: Euthymic   Insight: Fair   Judgement: Fair   Individualization: Gavino was active in their participation of group discussion/activity. Pt identified benefit of music as a coping skill "I grew up with it so it's been in my life forever".   Modes of Intervention: Activity, Discussion, and Education  Patient Response to Interventions:  Attentive, Engaged, and Interested   Plan: Continue to engage patient in OT groups 2 - 3x/week.  08/17/2020  Donne Hazel, MOT, OTR/L

## 2020-08-18 LAB — RESP PANEL BY RT-PCR (FLU A&B, COVID) ARPGX2
Influenza A by PCR: NEGATIVE
Influenza B by PCR: NEGATIVE
SARS Coronavirus 2 by RT PCR: NEGATIVE

## 2020-08-18 MED ORDER — GABAPENTIN 100 MG PO CAPS: 100.0000 mg | ORAL_CAPSULE | Freq: Three times a day (TID) | ORAL | 0 refills | Status: AC

## 2020-08-18 MED ORDER — QUETIAPINE FUMARATE 25 MG PO TABS: 75.0000 mg | ORAL_TABLET | Freq: Every day | ORAL | 0 refills | Status: AC

## 2020-08-18 MED ORDER — FLUOXETINE HCL 20 MG PO CAPS: 20.0000 mg | ORAL_CAPSULE | Freq: Every day | ORAL | 0 refills | Status: AC

## 2020-08-18 NOTE — [facility] Group Notes (Signed)
Adult Psychoeducational Group Note  Date:  08/18/2020 Time:  10:56 AM  Group Topic/Focus:  Goals Group:   The focus of this group is to help patients establish daily goals to achieve during treatment and discuss how the patient can incorporate goal setting into their daily lives to aide in recovery.  Participation Level:  Active  Participation Quality:  Attentive  Affect:  Appropriate  Cognitive:  Appropriate  Insight: Appropriate  Engagement in Group:  Engaged  Modes of Intervention:  Discussion  Additional Comments:   Patient attended goals group and stayed appropriate and engaged the duration of the group. Patient's goal was to stay positive.   Lalah Durango T Rachard Isidro 08/18/2020, 10:56 AM
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
## 2020-08-18 NOTE — B[facility] Group Notes (Signed)
Spiritual care group on grief and loss facilitated by chaplain Dyanne Carrel, [facility]   Group Goal:   Support / Education around grief and loss   Members engage in facilitated group support and psycho-social education.   Group Description:   Following introductions and group rules, group members engaged in facilitated group dialog and support around topic of loss, with particular support around experiences of loss in their lives. Group Identified types of loss (relationships / self / things) and identified patterns, circumstances, and changes that precipitate losses. Reflected on thoughts / feelings around loss, normalized grief responses, and recognized variety in grief experience. Group noted Worden's four tasks of grief in discussion.   Group drew on Adlerian / Rogerian, narrative, MI,   Patient Progress: Todd Moon attended group.  Participation was limited, but showed engagement in conversation.  Chaplain Dyanne Carrel, Bcc Pager, [phone] 12:18 PM

## 2020-08-18 NOTE — Progress Notes (Signed)
Discharge Note:  Patient discharged home with family member.  Suicide prevention information given and discussed with patient who stated he understood and had no questions.  Patient denied SI and HI, contracts for safety.  Denied A/V hallucinations.  Patient stated he received all his belongings, clothing, toiletries, etc.  Patient stated he appreciated all assistance received from [facility] staff.

## 2020-08-18 NOTE — BHH Suicide Risk Assessment (Signed)
B[facility] INPATIENT:  Family/Significant Other Suicide Prevention Education  Suicide Prevention Education:  Contact Attempts: Todd Moon(girlfriend)  [phone], (name of family member/significant other) has been identified by the patient as the family member/significant other with whom the patient will be residing, and identified as the person(s) who will aid the patient in the event of a mental health crisis.  With written consent from the patient, two attempts were made to provide suicide prevention education, prior to and/or following the patient's discharge.  We were unsuccessful in providing suicide prevention education.  A suicide education pamphlet was given to the patient to share with family/significant other.  Date and time of first attempt:08/18/20    /   9:57am CSW left a HIPAA compliant message.  Date and time of second attempt:CSW will make another attempt at a later time.   Todd Moon 08/18/2020, 10:02 AM

## 2020-08-18 NOTE — Progress Notes (Signed)
D:  Patient denied SI and HI, contracts for safety.  Denied A/V hallucinations.  Denied pain. A:  Medications administered per MD orders.  Emotional support and encouragement given patient. R:  Safety maintained with 15 minute checks.  

## 2020-08-18 NOTE — BHH Suicide Risk Assessment (Signed)
[facility] Discharge Suicide Risk Assessment   Principal Problem: Anxiety disorder, unspecified Discharge Diagnoses: Principal Problem:   Anxiety disorder, unspecified Active Problems:   Substance induced mood disorder (HCC)   Panic attacks   Cannabis use disorder, severe, dependence (HCC)   Total Time spent with patient: 15 minutes  Musculoskeletal: Strength & Muscle Tone: within normal limits Gait & Station: normal Patient leans: N/A  Psychiatric Specialty Exam  Presentation  General Appearance: Disheveled  Eye Contact:Good  Speech:Pressured  Speech Volume:Normal  Handedness:Right   Mood and Affect  Mood:Anxious; Depressed  Duration of Depression Symptoms: Less than two weeks  Affect:Congruent; Tearful   Thought Process  Thought Processes:Coherent; Goal Directed; Linear  Descriptions of Associations:Intact  Orientation:Full (Time, Place and Person)  Thought Content:Rumination  History of Schizophrenia/Schizoaffective disorder:No  Duration of Psychotic Symptoms:N/A  Hallucinations:No data recorded Ideas of Reference:None  Suicidal Thoughts:No data recorded Homicidal Thoughts:No data recorded  Sensorium  Memory:Immediate Good; Recent Good; Remote Good  Judgment:Fair  Insight:Fair   Executive Functions  Concentration:Fair  Attention Span:Fair  Recall:Good  Fund of Knowledge:Good  Language:Fair   Psychomotor Activity  Psychomotor Activity: No data recorded  Assets  Assets:Communication Skills; Desire for Improvement; Financial Resources/Insurance; Housing; Leisure Time; Physical Health; Resilience; Social Support; Transportation; Vocational/Educational   Sleep  Sleep: No data recorded  Physical Exam: Physical Exam Vitals and nursing note reviewed.  HENT:     Head: Normocephalic and atraumatic.  Pulmonary:     Effort: Pulmonary effort is normal.  Neurological:     General: No focal deficit present.     Mental Status: He is  alert and oriented to person, place, and time.   Review of Systems  All other systems reviewed and are negative. Blood pressure (!) 129/52, pulse 69, temperature 97.9 F (36.6 C), temperature source Oral, resp. rate 16, height 5\' 10"  (1.778 m), weight 68 kg, SpO2 94 %. Body mass index is 21.52 kg/m.  Mental Status Per Nursing Assessment::   On Admission:  Suicidal ideation indicated by patient  Demographic Factors:  Male and Caucasian  Loss Factors: Financial problems/change in socioeconomic status  Historical Factors: Impulsivity  Risk Reduction Factors:   Employed, Living with another person, especially a relative, and Positive social support  Continued Clinical Symptoms:  Severe Anxiety and/or Agitation Depression:   Comorbid alcohol abuse/dependence Impulsivity Alcohol/Substance Abuse/Dependencies  Cognitive Features That Contribute To Risk:  None    Suicide Risk:  Minimal: No identifiable suicidal ideation.  Patients presenting with no risk factors but with morbid ruminations; may be classified as minimal risk based on the severity of the depressive symptoms   Follow-up Information     [facility], [phone] Behavioral Follow up on 09/11/2020.   Why: You have an appointment for medication management services on 09/11/20 at 1:30 pm.  This appointment will be held in person, at:  [address] [facility] [place], [place] (location/office)  * YOU MUST CALL PRIOR TO YOUR APPT FOR DETAILS * Contact information: [address] [place] [phone]         [phone], MD. Call.   Specialty: Psychology Why: Please call to schedule an appointment for therapy services.  This provider accepts Steele Sizer and has Virtual appointments. Contact information: [address] [place] [phone]         [facility]. Call.   Why: Please call to schedule an appointment for therapy services after 08/24/20. Contact information: [address], [place], [place]  Phone: (  [phone]                Plan Of Care/Follow-up recommendations:  Activity:  ad lib  Antonieta Pert, MD 08/18/2020, 3:09 PM

## 2020-08-18 NOTE — Progress Notes (Signed)
   08/18/20 0400  Psych Admission Type (Psych Patients Only)  Admission Status Voluntary  Psychosocial Assessment  Patient Complaints Anxiety  Eye Contact Fair  Facial Expression Flat  Affect Appropriate to circumstance  Speech Logical/coherent  Interaction Assertive  Motor Activity Other (Comment) (unremarkable)  Appearance/Hygiene Unremarkable  Behavior Characteristics Cooperative  Mood Anxious  Thought Process  Coherency WDL  Content WDL  Delusions None reported or observed  Perception WDL  Hallucination None reported or observed  Judgment Poor  Confusion None  Danger to Self  Current suicidal ideation? Denies  Danger to Others  Danger to Others None reported or observed

## 2020-08-18 NOTE — BHH Suicide Risk Assessment (Addendum)
B[facility] INPATIENT:  Family/Significant Other Suicide Prevention Education  Suicide Prevention Education:  Education Completed; Melony McCullock(girlfriend)  [phone],  (name of family member/significant other) has been identified by the patient as the family member/significant other with whom the patient will be residing, and identified as the person(s) who will aid the patient in the event of a mental health crisis (suicidal ideations/suicide attempt).  With written consent from the patient, the family member/significant other has been provided the following suicide prevention education, prior to the and/or following the discharge of the patient.  The suicide prevention education provided includes the following: Suicide risk factors Suicide prevention and interventions National Suicide Hotline telephone number [facility] assessment telephone number [facility] Emergency Assistance 911 [facility] and/or Residential Mobile Crisis Unit telephone number  Request made of family/significant other to: Remove weapons (e.g., guns, rifles, knives), all items previously/currently identified as safety concern.   Remove drugs/medications (over-the-counter, prescriptions, illicit drugs), all items previously/currently identified as a safety concern.  The family member/significant other verbalizes understanding of the suicide prevention education information provided.  The family member/significant other agrees to remove the items of safety concern listed above.  "He started having bad thoughts about hurting people, but he didn't want to hurt people. He started having panic attacks because of it. This all started last Friday while I was at work."   -Firearms have been removed from the home. -Medications have been secured. -No safety concerns reported and pt feels she and the child is safe in the home. -Ms. McCullock was notified that about pt's HI towards child but was made aware of no  plan or intent.  -Discussed outpatient resources.  Felizardo Hoffmann 08/18/2020, 10:47 AM

## 2020-08-18 NOTE — Plan of Care (Signed)
Nurse discussed coping skills with patient.  

## 2020-08-18 NOTE — Plan of Care (Addendum)
Patient is looking forward to discharge today.

## 2020-08-18 NOTE — Progress Notes (Signed)
  [facility] Adult Case Management Discharge Plan :  Will you be returning to the same living situation after discharge:  Yes,  personal home At discharge, do you have transportation home?: Yes,  via girlfriend Do you have the ability to pay for your medications: Yes,  private insurance  Release of information consent forms completed and in the chart;  Patient's signature needed at discharge.  Patient to Follow up at:  Follow-up Information     [facility] Follow up on 09/11/2020.   Why: You have an appointment for medication management services on 09/11/20 at 1:30 pm.  This appointment will be held in person, at:  [address] [facility] [place], [place] (location/office)  * YOU MUST CALL PRIOR TO YOUR APPT FOR DETAILS * Contact information: [address] [place] [address] [phone]         Steele Sizer, MD. Call.   Specialty: Psychology Why: Please call to schedule an appointment for therapy services.  This provider accepts [employer] and has Virtual appointments. Contact information: [address] [place] [address] [phone]         [facility]. Call.   Why: Please call to schedule an appointment for therapy services after 08/24/20. Contact information: [address], [place], [place] [address]  Phone: [phone]                Next level of care provider has access to [facility] Link:no  Safety Planning and Suicide Prevention discussed: Yes,  girlfriend     Has patient been referred to the Quitline?: Patient refused referral  Patient has been referred for addiction treatment: N/A  Felizardo Hoffmann, Theresia Majors 08/18/2020, 3:40 PM

## 2021-01-20 ENCOUNTER — Ambulatory Visit
Admission: EM | Admit: 2021-01-20 | Discharge: 2021-01-20 | Disposition: A | Discharge status: Home / Self Care | Payer: Self-pay | Attending: Physician Assistant | Admitting: Physician Assistant

## 2021-01-20 ENCOUNTER — Other Ambulatory Visit: Payer: Self-pay

## 2021-01-20 DIAGNOSIS — U071 COVID-19: Secondary | ICD-10-CM | POA: Insufficient documentation

## 2021-01-20 DIAGNOSIS — R509 Fever, unspecified: Secondary | ICD-10-CM | POA: Insufficient documentation

## 2021-01-20 DIAGNOSIS — R112 Nausea with vomiting, unspecified: Secondary | ICD-10-CM | POA: Insufficient documentation

## 2021-01-20 LAB — RESP PANEL BY RT-PCR (FLU A&B, COVID) ARPGX2
Influenza A by PCR: NEGATIVE
Influenza B by PCR: NEGATIVE
SARS Coronavirus 2 by RT PCR: POSITIVE — AB

## 2021-01-20 MED ORDER — ONDANSETRON HCL 4 MG PO TABS: 4.0000 mg | ORAL_TABLET | Freq: Four times a day (QID) | ORAL | 0 refills | Status: AC | PRN

## 2021-01-20 NOTE — ED Provider Notes (Signed)
[facility]    CSN: [id number] Arrival date & time: 01/20/21  1657      History   Chief Complaint Chief Complaint  Patient presents with   Generalized Body Aches    HPI Todd Moon is a 26 y.o. male presenting for sudden onset of fever to 101 degrees, fatigue, body aches, nausea/vomiting, cough and congestion this morning.  Patient reports exposure to COVID-19 through his sister recently.  Has not been taking any medicine for symptoms.  He is denying any breathing trouble, sore throat or diarrhea.  No history of cardiopulmonary disease.  No other complaints.  HPI  Past Medical History:  Diagnosis Date   Kidney stones     Patient Active Problem List   Diagnosis Date Noted   Cannabis use disorder, severe, dependence (HCC) 08/14/2020   Anxiety disorder, unspecified 08/13/2020   Substance induced mood disorder (HCC) 08/13/2020   Panic attacks 08/13/2020   Homicidal ideations    Paranoid behavior (HCC)    Laceration of finger of left hand, subsequent encounter 05/07/2015   Tremor 07/26/2012    Past Surgical History:  Procedure Laterality Date   TONSILLECTOMY     TYMPANOSTOMY TUBE PLACEMENT         Home Medications    Prior to Admission medications   Medication Sig Start Date End Date Taking? Authorizing Provider  busPIRone (BUSPAR) 15 MG tablet Take 15 mg by mouth 2 (two) times daily. 12/30/20  Yes [provider]  FLUoxetine (PROZAC) 20 MG capsule Take 1 capsule (20 mg total) by mouth daily. 08/19/20  Yes Carlyn Reichert, MD  gabapentin (NEURONTIN) 100 MG capsule Take 1 capsule (100 mg total) by mouth 3 (three) times daily. 08/18/20  Yes Carlyn Reichert, MD  ondansetron (ZOFRAN) 4 MG tablet Take 1 tablet (4 mg total) by mouth every 6 (six) hours as needed for up to 5 days for nausea or vomiting. 01/20/21 01/25/21 Yes Eusebio Friendly B, PA-C  hydrOXYzine (ATARAX/VISTARIL) 25 MG tablet Take 1 tablet (25 mg total) by mouth 3 (three) times daily as  needed. 08/08/20   Oneta Rack, NP  QUEtiapine (SEROQUEL) 25 MG tablet Take 3 tablets (75 mg total) by mouth at bedtime. 08/18/20   Carlyn Reichert, MD  pantoprazole (PROTONIX) 20 MG tablet Take 1 tablet (20 mg total) by mouth daily. 05/09/17 09/01/19  Lutricia Feil, PA-C    Family History Family History  Problem Relation Age of Onset   Healthy Mother    Healthy Father     Social History Social History   Tobacco Use   Smoking status: Never   Smokeless tobacco: Former    Types: Chew    Quit date: 09/01/2018  Vaping Use   Vaping Use: Never used  Substance Use Topics   Alcohol use: Yes    Comment: social   Drug use: Never     Allergies   Patient has no known allergies.   Review of Systems Review of Systems  Constitutional:  Positive for fatigue and fever.  HENT:  Positive for congestion. Negative for rhinorrhea and sore throat.   Respiratory:  Positive for cough. Negative for shortness of breath.   Gastrointestinal:  Positive for nausea and vomiting. Negative for abdominal pain and diarrhea.  Musculoskeletal:  Positive for myalgias.  Neurological:  Negative for weakness, light-headedness and headaches.  Hematological:  Negative for adenopathy.    Physical Exam Triage Vital Signs ED Triage Vitals  Enc Vitals Group     BP 01/20/21 1733  127/86     Pulse Rate 01/20/21 1733 95     Resp 01/20/21 1733 18     Temp 01/20/21 1733 99.6 F (37.6 C)     Temp Source 01/20/21 1733 Oral     SpO2 01/20/21 1733 97 %     Weight 01/20/21 1731 160 lb (72.6 kg)     Height 01/20/21 1731 5\' 10"  (1.778 m)     Head Circumference --      Peak Flow --      Pain Score 01/20/21 1731 6     Pain Loc --      Pain Edu? --      Excl. in GC? --    No data found.  Updated Vital Signs BP 127/86 (BP Location: Left Arm)    Pulse 95    Temp 99.6 F (37.6 C) (Oral)    Resp 18    Ht 5\' 10"  (1.778 m)    Wt 160 lb (72.6 kg)    SpO2 97%    BMI 22.96 kg/m   Physical Exam Vitals and nursing  note reviewed.  Constitutional:      General: He is not in acute distress.    Appearance: Normal appearance. He is well-developed. He is ill-appearing.  HENT:     Head: Normocephalic and atraumatic.     Nose: Congestion present.     Mouth/Throat:     Mouth: Mucous membranes are moist.     Pharynx: Oropharynx is clear.  Eyes:     General: No scleral icterus.    Conjunctiva/sclera: Conjunctivae normal.  Cardiovascular:     Rate and Rhythm: Normal rate and regular rhythm.     Heart sounds: Normal heart sounds.  Pulmonary:     Effort: Pulmonary effort is normal. No respiratory distress.     Breath sounds: Normal breath sounds. No wheezing, rhonchi or rales.  Musculoskeletal:     Cervical back: Neck supple.  Skin:    General: Skin is warm and dry.     Capillary Refill: Capillary refill takes less than 2 seconds.  Neurological:     General: No focal deficit present.     Mental Status: He is alert. Mental status is at baseline.     Motor: No weakness.     Coordination: Coordination normal.     Gait: Gait normal.  Psychiatric:        Mood and Affect: Mood normal.        Behavior: Behavior normal.        Thought Content: Thought content normal.     UC Treatments / Results  Labs (all labs ordered are listed, but only abnormal results are displayed) Labs Reviewed  RESP PANEL BY RT-PCR (FLU A&B, COVID) ARPGX2 - Abnormal; Notable for the following components:      Result Value   SARS Coronavirus 2 by RT PCR POSITIVE (*)    All other components within normal limits    EKG   Radiology No results found.  Procedures Procedures (including critical care time)  Medications Ordered in UC Medications - No data to display  Initial Impression / Assessment and Plan / UC Course  I have reviewed the triage vital signs and the nursing notes.  Pertinent labs & imaging results that were available during my care of the patient were reviewed by me and considered in my medical decision  making (see chart for details).    26 year old male presenting for fever, fatigue, body aches, cough, congestion, nausea and vomiting  with sudden onset this morning.  Exposed to COVID.  Vitals all stable.  Patient ill-appearing but nontoxic.  On exam he does have nasal congestion.  Oropharynx clear.  Chest clear to auscultation heart regular rate and rhythm.  Abdomen soft and nontender.  Respiratory panel obtained to assess for influenza and COVID-19.  Positive COVID-19.  Discussed results with patient.  Reviewed current CDC guidance, isolation protocol and ED precautions.  Advised increasing rest and fluids, over-the-counter Mucinex or DayQuil/NyQuil.  Sent Zofran.  Provided him a work note.  Reviewed when/if to return.  Final Clinical Impressions(s) / UC Diagnoses   Final diagnoses:  COVID-19  Fever, unspecified  Nausea and vomiting, unspecified vomiting type     Discharge Instructions      -COVID test is positive.  You need to isolate 5 days and wear a mask for 5 days.  Day 1 starts tomorrow.  Increase rest and fluids.  I have sent antinausea medicine to pharmacy.  Start Mucinex, Tylenol Motrin as needed for fever control. - You should feel better over the next 1 week. - You need to go to the ER if any severe acute worsening of her symptoms especially if you have trouble breathing or increased weakness.     ED Prescriptions     Medication Sig Dispense Auth. Provider   ondansetron (ZOFRAN) 4 MG tablet Take 1 tablet (4 mg total) by mouth every 6 (six) hours as needed for up to 5 days for nausea or vomiting. 20 tablet Gareth MorganEaves, Amarian Botero B, PA-C      PDMP not reviewed this encounter.   Shirlee Latchaves, Shelvy Perazzo B, PA-C 01/20/21 1837

## 2021-01-20 NOTE — Discharge Instructions (Signed)
-  COVID test is positive.  You need to isolate 5 days and wear a mask for 5 days.  Day 1 starts tomorrow.  Increase rest and fluids.  I have sent antinausea medicine to pharmacy.  Start Mucinex, Tylenol Motrin as needed for fever control. - You should feel better over the next 1 week. - You need to go to the ER if any severe acute worsening of her symptoms especially if you have trouble breathing or increased weakness.

## 2021-01-20 NOTE — ED Triage Notes (Signed)
Pt here with C/O waking up this morning with body aches, fever (101), vomiting, nausea.
